# Patient Record
Sex: Male | Born: 1940 | Race: White | Hispanic: No | Marital: Married | Smoking: Former smoker
Health system: Southern US, Community
[De-identification: ages and names within clinical notes are randomized; demographics above are authoritative.]

## PROBLEM LIST (undated history)

## (undated) DIAGNOSIS — N183 Chronic kidney disease, stage 3 unspecified: Secondary | ICD-10-CM

## (undated) DIAGNOSIS — E7800 Pure hypercholesterolemia, unspecified: Secondary | ICD-10-CM

## (undated) DIAGNOSIS — H25019 Cortical age-related cataract, unspecified eye: Secondary | ICD-10-CM

## (undated) DIAGNOSIS — K635 Polyp of colon: Secondary | ICD-10-CM

## (undated) DIAGNOSIS — E785 Hyperlipidemia, unspecified: Secondary | ICD-10-CM

## (undated) DIAGNOSIS — I10 Essential (primary) hypertension: Secondary | ICD-10-CM

## (undated) DIAGNOSIS — M549 Dorsalgia, unspecified: Secondary | ICD-10-CM

## (undated) DIAGNOSIS — G8929 Other chronic pain: Secondary | ICD-10-CM

## (undated) DIAGNOSIS — E119 Type 2 diabetes mellitus without complications: Secondary | ICD-10-CM

## (undated) DIAGNOSIS — K644 Residual hemorrhoidal skin tags: Secondary | ICD-10-CM

## (undated) DIAGNOSIS — M1990 Unspecified osteoarthritis, unspecified site: Secondary | ICD-10-CM

## (undated) HISTORY — PX: CHOLECYSTECTOMY: SHX55

## (undated) HISTORY — PX: CATARACT EXTRACTION: SUR2

## (undated) HISTORY — PX: APPENDECTOMY: SHX54

---

## 2009-06-27 ENCOUNTER — Ambulatory Visit: Payer: Self-pay | Admitting: Urology

## 2009-09-29 ENCOUNTER — Ambulatory Visit: Payer: Self-pay | Admitting: Internal Medicine

## 2010-03-31 ENCOUNTER — Ambulatory Visit: Payer: Self-pay | Admitting: Internal Medicine

## 2010-09-06 IMAGING — CT CT ABDOMEN W/ CM
1 of 2 series · 14 of 32 positions shown, 19 images · non-contrast
Comparison: None

REASON FOR EXAM: 6 MO F/U liver mass
COMMENTS:

PROCEDURE:     CT  - CT ABDOMEN STANDARD W  - March 31, 2010  [DATE]
RESULT:     History: 09/29/2009, 06/27/2009
TECHNIQUE: Multiple axial images of the abdomen were performed from the lung
bases to the iliac crests, with p.o. contrast and with 100 mL of Osovue-EF3
intravenous contrast.

[Series 2: abdomen · axial · 0.77mm/px · z∈[-821,-536]mm · 14 of 65 slices shown, 19 images]
[im 4/65  soft-tissue]
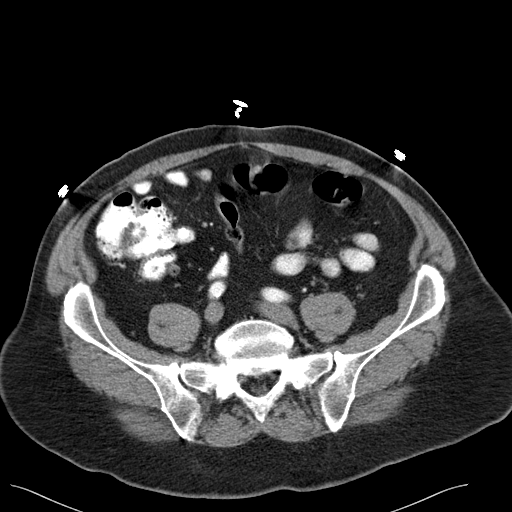
[im 4/65  bone]
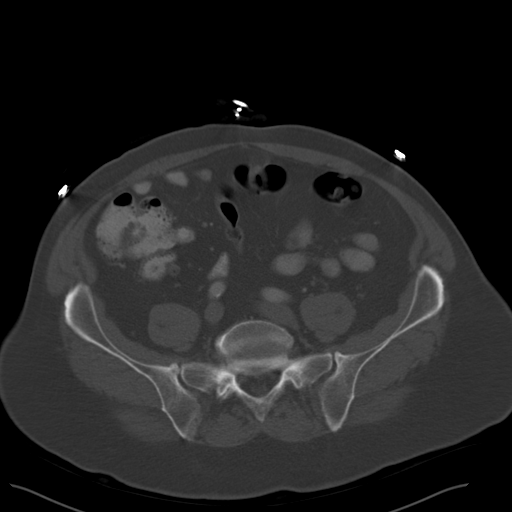
[im 10/65  soft-tissue]
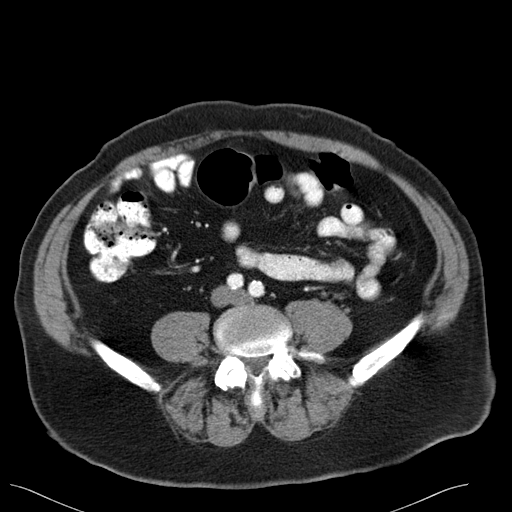
[im 13/65  soft-tissue]
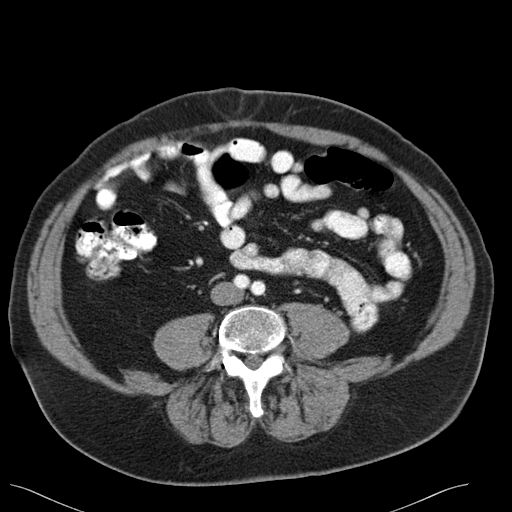
[im 19/65  soft-tissue]
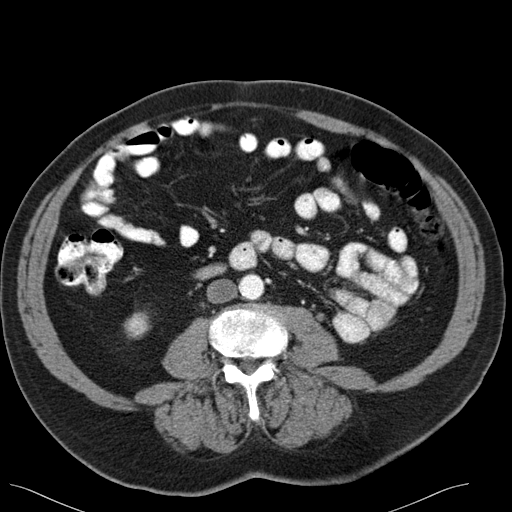
[im 22/65  soft-tissue]
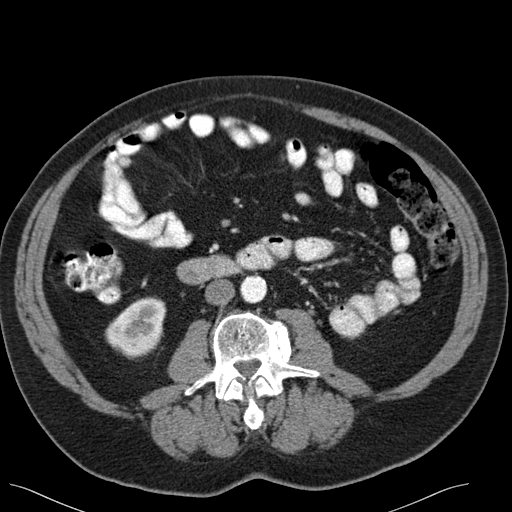
[im 28/65  soft-tissue]
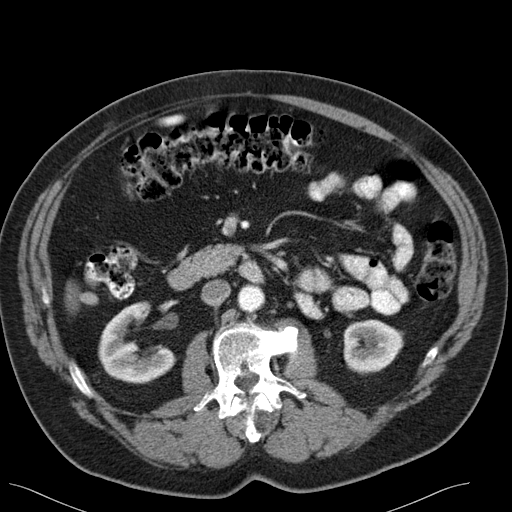
[im 34/65  soft-tissue]
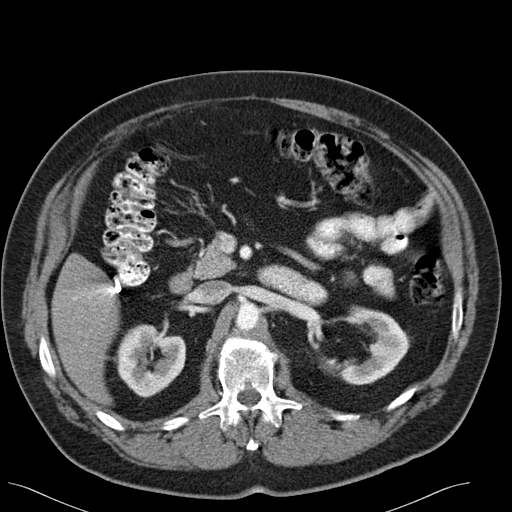
[im 37/65  soft-tissue]
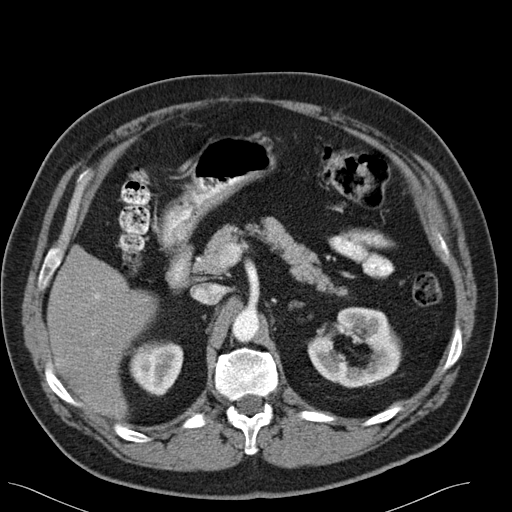
[im 43/65  soft-tissue]
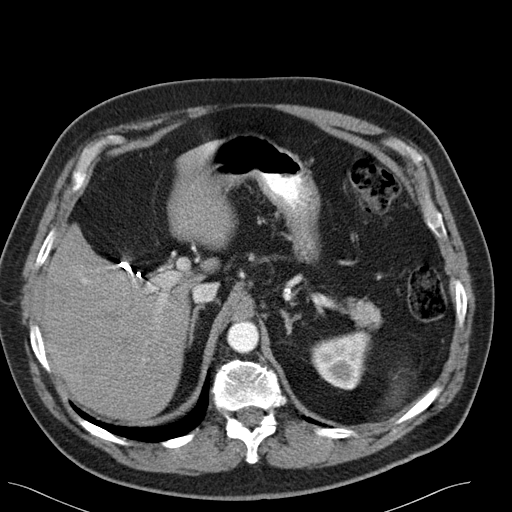
[im 43/65  bone]
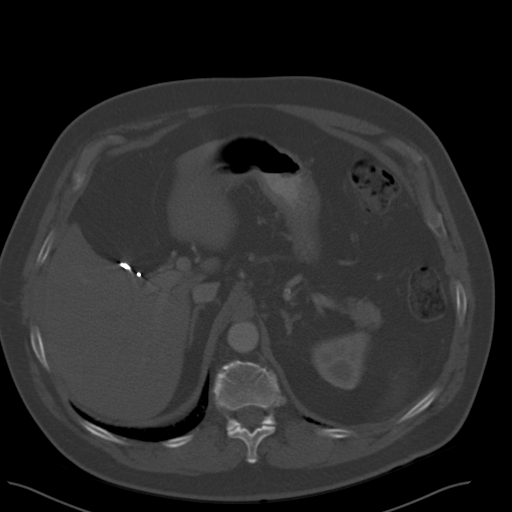
[im 46/65  soft-tissue]
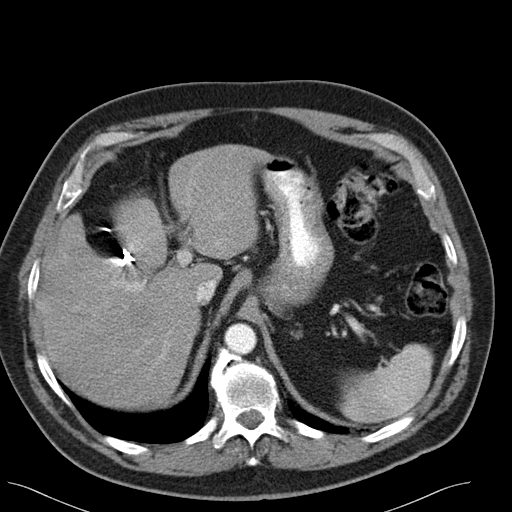
[im 52/65  soft-tissue]
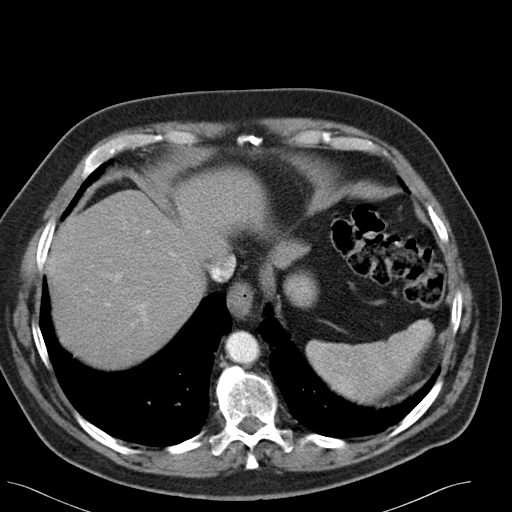
[im 52/65  lung]
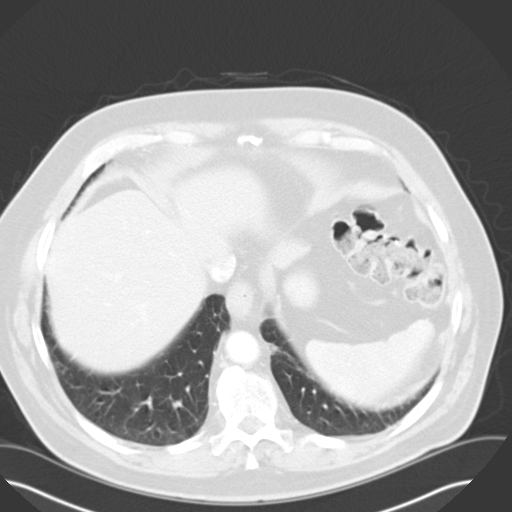
[im 55/65  soft-tissue]
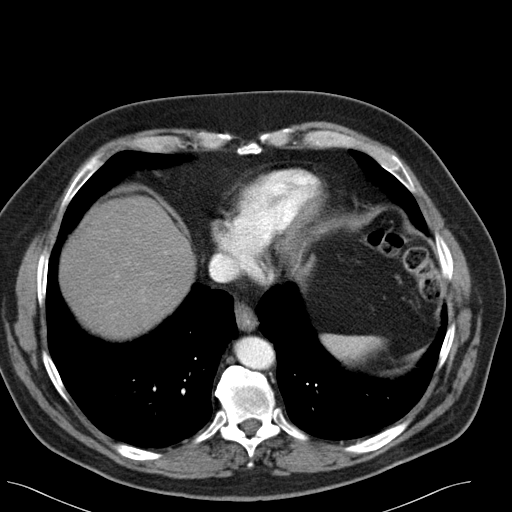
[im 55/65  lung]
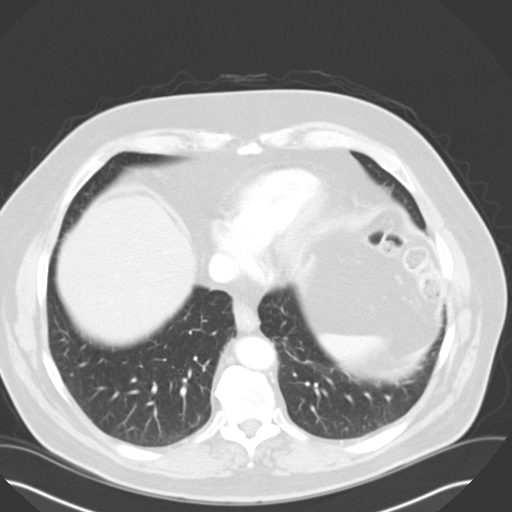
[im 58/65  lung]
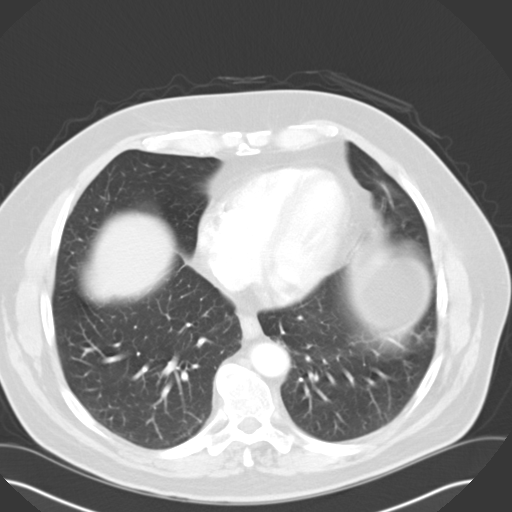
[im 61/65  soft-tissue]
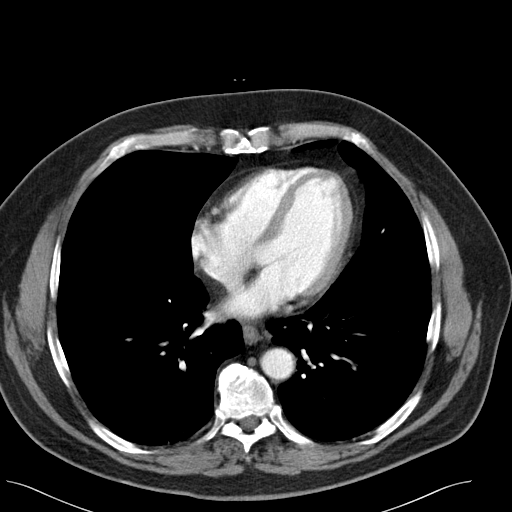
[im 61/65  lung]
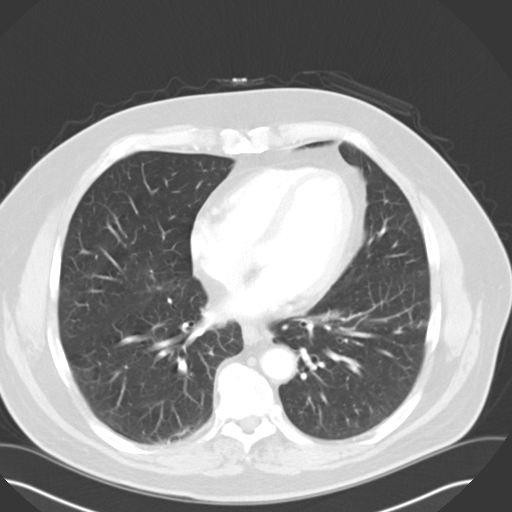

[14 of 32 positions shown; findings below may reference images not displayed]

FINDINGS: The lung bases are clear. There is no pneumothorax. The heart size is
normal.

The liver demonstrates no focal abnormality. There is no intrahepatic or
extrahepatic biliary ductal dilatation. There is a stable nodular density
inferior to the right hepatic lobe which may represent a lymph node which is
unchanged compared with 09/29/2009 and 06/27/2009. The gallbladder is
surgically absent. The spleen demonstrates no focal abnormality. The
kidneys, adrenal glands, and pancreas are normal.

There is a small fat-containing umbilical hernia. There is a small axial
type hiatal hernia. The visualized portions of the duodenum, small
intestine, and large intestine demonstrate no contrast extravasation or
dilatation. There is no pneumoperitoneum, pneumatosis, or portal venous gas.
There is no abdominal free fluid. There is no lymphadenopathy.

The abdominal aorta is normal in caliber with atherosclerosis.

The osseous structures are unremarkable.
IMPRESSION: There is a stable nodular density inferior to the right hepatic lobe which
may represent a lymph node which is unchanged compared with 09/29/2009 and
06/27/2009.

## 2015-09-18 ENCOUNTER — Encounter: Payer: Self-pay | Admitting: *Deleted

## 2015-09-19 ENCOUNTER — Ambulatory Visit
Admission: RE | Admit: 2015-09-19 | Discharge: 2015-09-19 | Disposition: A | Discharge status: Home / Self Care | Payer: Medicare HMO | Source: Ambulatory Visit | Attending: Gastroenterology | Admitting: Gastroenterology

## 2015-09-19 ENCOUNTER — Ambulatory Visit: Payer: Medicare HMO | Admitting: Anesthesiology

## 2015-09-19 ENCOUNTER — Encounter: Payer: Self-pay | Admitting: Anesthesiology

## 2015-09-19 ENCOUNTER — Encounter
Admission: RE | Disposition: A | Discharge status: Home / Self Care | Payer: Self-pay | Source: Ambulatory Visit | Attending: Gastroenterology

## 2015-09-19 DIAGNOSIS — D120 Benign neoplasm of cecum: Secondary | ICD-10-CM | POA: Diagnosis not present

## 2015-09-19 DIAGNOSIS — G8929 Other chronic pain: Secondary | ICD-10-CM | POA: Insufficient documentation

## 2015-09-19 DIAGNOSIS — Z87891 Personal history of nicotine dependence: Secondary | ICD-10-CM | POA: Diagnosis not present

## 2015-09-19 DIAGNOSIS — D122 Benign neoplasm of ascending colon: Secondary | ICD-10-CM | POA: Insufficient documentation

## 2015-09-19 DIAGNOSIS — K640 First degree hemorrhoids: Secondary | ICD-10-CM | POA: Diagnosis not present

## 2015-09-19 DIAGNOSIS — E1122 Type 2 diabetes mellitus with diabetic chronic kidney disease: Secondary | ICD-10-CM | POA: Insufficient documentation

## 2015-09-19 DIAGNOSIS — E780 Pure hypercholesterolemia: Secondary | ICD-10-CM | POA: Diagnosis not present

## 2015-09-19 DIAGNOSIS — I10 Essential (primary) hypertension: Secondary | ICD-10-CM | POA: Insufficient documentation

## 2015-09-19 DIAGNOSIS — N183 Chronic kidney disease, stage 3 (moderate): Secondary | ICD-10-CM | POA: Insufficient documentation

## 2015-09-19 DIAGNOSIS — Z79899 Other long term (current) drug therapy: Secondary | ICD-10-CM | POA: Insufficient documentation

## 2015-09-19 DIAGNOSIS — I129 Hypertensive chronic kidney disease with stage 1 through stage 4 chronic kidney disease, or unspecified chronic kidney disease: Secondary | ICD-10-CM | POA: Insufficient documentation

## 2015-09-19 DIAGNOSIS — E785 Hyperlipidemia, unspecified: Secondary | ICD-10-CM | POA: Diagnosis not present

## 2015-09-19 DIAGNOSIS — Z7982 Long term (current) use of aspirin: Secondary | ICD-10-CM | POA: Insufficient documentation

## 2015-09-19 DIAGNOSIS — Z1211 Encounter for screening for malignant neoplasm of colon: Secondary | ICD-10-CM | POA: Diagnosis not present

## 2015-09-19 DIAGNOSIS — K644 Residual hemorrhoidal skin tags: Secondary | ICD-10-CM | POA: Diagnosis not present

## 2015-09-19 DIAGNOSIS — M549 Dorsalgia, unspecified: Secondary | ICD-10-CM | POA: Diagnosis not present

## 2015-09-19 HISTORY — DX: Essential (primary) hypertension: I10

## 2015-09-19 HISTORY — DX: Hyperlipidemia, unspecified: E78.5

## 2015-09-19 HISTORY — DX: Type 2 diabetes mellitus without complications: E11.9

## 2015-09-19 HISTORY — PX: COLONOSCOPY WITH PROPOFOL: SHX5780

## 2015-09-19 HISTORY — DX: Other chronic pain: G89.29

## 2015-09-19 HISTORY — DX: Dorsalgia, unspecified: M54.9

## 2015-09-19 HISTORY — DX: Pure hypercholesterolemia, unspecified: E78.00

## 2015-09-19 LAB — GLUCOSE, CAPILLARY: GLUCOSE-CAPILLARY: 132 mg/dL — AB (ref 65–99)

## 2015-09-19 SURGERY — COLONOSCOPY WITH PROPOFOL
Anesthesia: General

## 2015-09-19 MED ORDER — PROPOFOL 10 MG/ML IV BOLUS
INTRAVENOUS | Status: DC | PRN
  Administered 2015-09-19: 30 mg via INTRAVENOUS
  Administered 2015-09-19 (×2): 20 mg via INTRAVENOUS

## 2015-09-19 MED ORDER — LIDOCAINE HCL (CARDIAC) 20 MG/ML IV SOLN
INTRAVENOUS | Status: DC | PRN
  Administered 2015-09-19: 60 mg via INTRAVENOUS

## 2015-09-19 MED ORDER — SODIUM CHLORIDE 0.9 % IV SOLN: INTRAVENOUS | Status: DC

## 2015-09-19 MED ORDER — PROPOFOL 500 MG/50ML IV EMUL
INTRAVENOUS | Status: DC | PRN
  Administered 2015-09-19: 100 ug/kg/min via INTRAVENOUS

## 2015-09-19 MED ORDER — SODIUM CHLORIDE 0.9 % IV SOLN
INTRAVENOUS | Status: DC
  Administered 2015-09-19: 1000 mL via INTRAVENOUS

## 2015-09-19 MED ORDER — PHENYLEPHRINE HCL 10 MG/ML IJ SOLN
INTRAMUSCULAR | Status: DC | PRN
  Administered 2015-09-19 (×4): 200 ug via INTRAVENOUS

## 2015-09-19 NOTE — Anesthesia Postprocedure Evaluation (Signed)
  Anesthesia Post-op Note  Patient: Juan Calhoun  Procedure(s) Performed: Procedure(s): COLONOSCOPY WITH PROPOFOL (N/A)  Anesthesia type:General  Patient location: PACU  Post pain: Pain level controlled  Post assessment: Post-op Vital signs reviewed, Patient's Cardiovascular Status Stable, Respiratory Function Stable, Patent Airway and No signs of Nausea or vomiting  Post vital signs: Reviewed and stable  Last Vitals:  Filed Vitals:   09/19/15 0920  BP: 100/72  Pulse: 64  Temp:   Resp: 16    Level of consciousness: awake, alert  and patient cooperative  Complications: No apparent anesthesia complications

## 2015-09-19 NOTE — Op Note (Signed)
[facility] Patient Name: Juan Calhoun Procedure Date: 09/19/2015 7:47 AM MRN: [id number] Account #: [id number] Date of Birth: 08-18-1941 Admit Type: Outpatient Age: 74 Room: [facility] ENDO ROOM 1 Gender: Male Note Status: Finalized Procedure:         Colonoscopy Indications:       Screening for colorectal malignant neoplasm, Last                     colonoscopy over 10 years ago Patient Profile:   This is a 74 year old male. Providers:         Gerrit Heck. Rayann Heman, MD Referring MD:      Ocie Cornfield. Ouida Sills, MD (Referring MD) Medicines:         Propofol per Anesthesia Complications:     No immediate complications. Procedure:         Pre-Anesthesia Assessment:                    - Prior to the procedure, a History and Physical was                     performed, and patient medications, allergies and                     sensitivities were reviewed. The patient's tolerance of                     previous anesthesia was reviewed.                    After obtaining informed consent, the colonoscope was                     passed under direct vision. Throughout the procedure, the                     patient's blood pressure, pulse, and oxygen saturations                     were monitored continuously. The Colonoscope was                     introduced through the anus and advanced to the the cecum,                     identified by appendiceal orifice and ileocecal valve. The                     colonoscopy was performed without difficulty. The patient                     tolerated the procedure well. The quality of the bowel                     preparation was good. Findings:      The perianal exam findings include non-thrombosed external hemorrhoids.      A 3 mm polyp was found in the cecum. The polyp was sessile. The polyp       was removed with a jumbo cold forceps. Resection and retrieval were       complete.      A 8 mm polyp was found in the  ascending colon. The polyp was flat. The       polyp was removed with a hot snare. Resection and retrieval were  complete.      Internal hemorrhoids were found during retroflexion. The hemorrhoids       were Grade I (internal hemorrhoids that do not prolapse).      The exam was otherwise without abnormality. Impression:        - Non-thrombosed external hemorrhoids found on perianal                     exam.                    - One 3 mm polyp in the cecum. Resected and retrieved.                    - One 8 mm polyp in the ascending colon. Resected and                     retrieved.                    - Internal hemorrhoids.                    - The examination was otherwise normal. Recommendation:    - Observe patient in GI recovery unit.                    - High fiber diet.                    - Continue present medications.                    - Await pathology results.                    - Repeat colonoscopy for surveillance based on pathology                     results.                    - Return to referring physician.                    - The findings and recommendations were discussed with the                     patient.                    - The findings and recommendations were discussed with the                     patient's family. Procedure Code(s): --- Professional ---                    [phone], Colonoscopy, flexible; with removal of tumor(s),                     polyp(s), or other lesion(s) by snare technique                    23557, 73, Colonoscopy, flexible; with biopsy, single or                     multiple CPT copyright 2014 American Medical [employer]. All rights reserved. The codes documented in this report are preliminary and upon coder review may  be revised to meet current compliance requirements. Mellody Life, MD 09/19/2015 8:45:31 AM This report  has been signed electronically. Number of Addenda: 0 Note Initiated On: 09/19/2015 7:47 AM Scope  Withdrawal Time: 0 hours 16 minutes 11 seconds  Total Procedure Duration: 0 hours 22 minutes 26 seconds       [facility]

## 2015-09-19 NOTE — Discharge Instructions (Signed)

## 2015-09-19 NOTE — Anesthesia Preprocedure Evaluation (Signed)
Anesthesia Evaluation  Patient identified by MRN, date of birth, ID band Patient awake    Reviewed: Allergy & Precautions, H&P , NPO status , Patient's Chart, lab work & pertinent test results  Airway Mallampati: III  TM Distance: >3 FB Neck ROM: limited    Dental  (+) Poor Dentition, Chipped   Pulmonary neg pulmonary ROS, former smoker,    Pulmonary exam normal breath sounds clear to auscultation       Cardiovascular Exercise Tolerance: Good hypertension, (-) Past MI Normal cardiovascular exam Rhythm:regular Rate:Normal     Neuro/Psych negative neurological ROS  negative psych ROS   GI/Hepatic negative GI ROS, Neg liver ROS, neg GERD  ,  Endo/Other  diabetes, Type 2, Oral Hypoglycemic Agents  Renal/GU negative Renal ROS  negative genitourinary   Musculoskeletal   Abdominal   Peds  Hematology negative hematology ROS (+)   Anesthesia Other Findings Past Medical History:   Diabetes mellitus without complication                       Hypertension                                                 Hypercholesterolemia                                         Hyperlipidemia                                               Chronic back pain                                            Reproductive/Obstetrics negative OB ROS                             Anesthesia Physical Anesthesia Plan  ASA: III  Anesthesia Plan: General   Post-op Pain Management:    Induction:   Airway Management Planned:   Additional Equipment:   Intra-op Plan:   Post-operative Plan:   Informed Consent: I have reviewed the patients History and Physical, chart, labs and discussed the procedure including the risks, benefits and alternatives for the proposed anesthesia with the patient or authorized representative who has indicated his/her understanding and acceptance.   Dental Advisory Given  Plan Discussed with:  Anesthesiologist, CRNA and Surgeon  Anesthesia Plan Comments:         Anesthesia Quick Evaluation

## 2015-09-19 NOTE — Transfer of Care (Signed)
Immediate Anesthesia Transfer of Care Note  Patient: Juan Calhoun  Procedure(s) Performed: Procedure(s): COLONOSCOPY WITH PROPOFOL (N/A)  Patient Location: PACU  Anesthesia Type:MAC  Level of Consciousness: sedated  Airway & Oxygen Therapy: Patient Spontanous Breathing  Post-op Assessment: Report given to RN  Post vital signs: Reviewed  Last Vitals:  Filed Vitals:   09/19/15 0718  BP: 117/72  Pulse: 76  Temp: 36.2 C  Resp: 16    Complications: No apparent anesthesia complications

## 2015-09-19 NOTE — H&P (Signed)
  Primary Care Physician:  Kirk Ruths., MD  Pre-Procedure History & Physical: HPI:  Juan Calhoun is a 74 y.o. male is here for an colonoscopy.   Past Medical History  Diagnosis Date  . Diabetes mellitus without complication   . Hypertension   . Hypercholesterolemia   . Hyperlipidemia   . Chronic back pain     Past Surgical History  Procedure Laterality Date  . Cholecystectomy    . Appendectomy      Prior to Admission medications   Medication Sig Start Date End Date Taking? Authorizing Provider  aspirin EC 81 MG tablet Take 81 mg by mouth daily.   Yes Historical Provider, MD  atorvastatin (LIPITOR) 40 MG tablet Take 40 mg by mouth daily.   Yes Historical Provider, MD  azelastine (ASTELIN) 0.1 % nasal spray Place 1 spray into both nostrils 2 (two) times daily. Use in each nostril as directed   Yes Historical Provider, MD  lisinopril-hydrochlorothiazide (PRINZIDE,ZESTORETIC) 10-12.5 MG per tablet Take 1 tablet by mouth daily.   Yes Historical Provider, MD  metFORMIN (GLUCOPHAGE-XR) 500 MG 24 hr tablet Take 1,000 mg by mouth daily with breakfast. TAKE 2 TABLETS DAILY with SUPPER   Yes Historical Provider, MD    Allergies as of 08/20/2015  . (Not on File)    History reviewed. No pertinent family history.  Social History   Social History  . Marital Status: Widowed    Spouse Name: N/A  . Number of Children: N/A  . Years of Education: N/A   Occupational History  . Not on file.   Social History Main Topics  . Smoking status: Former Research scientist (life sciences)  . Smokeless tobacco: Never Used  . Alcohol Use: No  . Drug Use: No  . Sexual Activity: Not on file   Other Topics Concern  . Not on file   Social History Narrative     Physical Exam: BP 117/72 mmHg  Pulse 76  Temp(Src) 97.2 F (36.2 C) (Tympanic)  Resp 16  Ht 6' (1.829 m)  Wt 96.616 kg (213 lb)  BMI 28.88 kg/m2  SpO2 99% General:   Alert,  pleasant and cooperative in NAD Head:  Normocephalic and  atraumatic. Neck:  Supple; no masses or thyromegaly. Lungs:  Clear throughout to auscultation.    Heart:  Regular rate and rhythm. Abdomen:  Soft, nontender and nondistended. Normal bowel sounds, without guarding, and without rebound.   Neurologic:  Alert and  oriented x4;  grossly normal neurologically.  Impression/Plan: Juan Calhoun is here for an colonoscopy to be performed for screening  Risks, benefits, limitations, and alternatives regarding  colonoscopy have been reviewed with the patient.  Questions have been answered.  All parties agreeable.   Josefine Class, MD  09/19/2015, 8:12 AM

## 2015-09-21 ENCOUNTER — Encounter: Payer: Self-pay | Admitting: Gastroenterology

## 2015-09-22 LAB — SURGICAL PATHOLOGY

## 2018-10-06 DIAGNOSIS — E875 Hyperkalemia: Secondary | ICD-10-CM

## 2018-10-06 DIAGNOSIS — J9690 Respiratory failure, unspecified, unspecified whether with hypoxia or hypercapnia: Secondary | ICD-10-CM | POA: Diagnosis not present

## 2018-10-06 DIAGNOSIS — N179 Acute kidney failure, unspecified: Secondary | ICD-10-CM | POA: Diagnosis not present

## 2018-10-07 DIAGNOSIS — N179 Acute kidney failure, unspecified: Secondary | ICD-10-CM | POA: Diagnosis not present

## 2018-10-07 DIAGNOSIS — E875 Hyperkalemia: Secondary | ICD-10-CM | POA: Diagnosis not present

## 2018-10-07 DIAGNOSIS — J9690 Respiratory failure, unspecified, unspecified whether with hypoxia or hypercapnia: Secondary | ICD-10-CM | POA: Diagnosis not present

## 2018-10-08 DIAGNOSIS — E875 Hyperkalemia: Secondary | ICD-10-CM | POA: Diagnosis not present

## 2018-10-08 DIAGNOSIS — N179 Acute kidney failure, unspecified: Secondary | ICD-10-CM | POA: Diagnosis not present

## 2018-10-08 DIAGNOSIS — J9690 Respiratory failure, unspecified, unspecified whether with hypoxia or hypercapnia: Secondary | ICD-10-CM | POA: Diagnosis not present

## 2018-10-09 DIAGNOSIS — E875 Hyperkalemia: Secondary | ICD-10-CM | POA: Diagnosis not present

## 2018-10-09 DIAGNOSIS — N179 Acute kidney failure, unspecified: Secondary | ICD-10-CM | POA: Diagnosis not present

## 2018-10-09 DIAGNOSIS — J9690 Respiratory failure, unspecified, unspecified whether with hypoxia or hypercapnia: Secondary | ICD-10-CM | POA: Diagnosis not present

## 2018-10-10 DIAGNOSIS — E875 Hyperkalemia: Secondary | ICD-10-CM | POA: Diagnosis not present

## 2018-10-10 DIAGNOSIS — J9690 Respiratory failure, unspecified, unspecified whether with hypoxia or hypercapnia: Secondary | ICD-10-CM | POA: Diagnosis not present

## 2018-10-10 DIAGNOSIS — N179 Acute kidney failure, unspecified: Secondary | ICD-10-CM | POA: Diagnosis not present

## 2018-10-11 DIAGNOSIS — E875 Hyperkalemia: Secondary | ICD-10-CM | POA: Diagnosis not present

## 2018-10-11 DIAGNOSIS — J9690 Respiratory failure, unspecified, unspecified whether with hypoxia or hypercapnia: Secondary | ICD-10-CM | POA: Diagnosis not present

## 2018-10-11 DIAGNOSIS — N179 Acute kidney failure, unspecified: Secondary | ICD-10-CM | POA: Diagnosis not present

## 2018-10-12 DIAGNOSIS — E875 Hyperkalemia: Secondary | ICD-10-CM | POA: Diagnosis not present

## 2018-10-12 DIAGNOSIS — J9690 Respiratory failure, unspecified, unspecified whether with hypoxia or hypercapnia: Secondary | ICD-10-CM | POA: Diagnosis not present

## 2018-10-12 DIAGNOSIS — N179 Acute kidney failure, unspecified: Secondary | ICD-10-CM | POA: Diagnosis not present

## 2018-10-13 DIAGNOSIS — N179 Acute kidney failure, unspecified: Secondary | ICD-10-CM | POA: Diagnosis not present

## 2018-10-13 DIAGNOSIS — E875 Hyperkalemia: Secondary | ICD-10-CM | POA: Diagnosis not present

## 2018-10-13 DIAGNOSIS — J9690 Respiratory failure, unspecified, unspecified whether with hypoxia or hypercapnia: Secondary | ICD-10-CM | POA: Diagnosis not present

## 2018-10-14 DIAGNOSIS — N179 Acute kidney failure, unspecified: Secondary | ICD-10-CM | POA: Diagnosis not present

## 2018-10-14 DIAGNOSIS — E875 Hyperkalemia: Secondary | ICD-10-CM | POA: Diagnosis not present

## 2018-10-14 DIAGNOSIS — J9690 Respiratory failure, unspecified, unspecified whether with hypoxia or hypercapnia: Secondary | ICD-10-CM | POA: Diagnosis not present

## 2018-10-15 DIAGNOSIS — N179 Acute kidney failure, unspecified: Secondary | ICD-10-CM | POA: Diagnosis not present

## 2018-10-15 DIAGNOSIS — E875 Hyperkalemia: Secondary | ICD-10-CM | POA: Diagnosis not present

## 2018-10-15 DIAGNOSIS — J9690 Respiratory failure, unspecified, unspecified whether with hypoxia or hypercapnia: Secondary | ICD-10-CM | POA: Diagnosis not present

## 2018-10-16 DIAGNOSIS — E875 Hyperkalemia: Secondary | ICD-10-CM | POA: Diagnosis not present

## 2018-10-16 DIAGNOSIS — N179 Acute kidney failure, unspecified: Secondary | ICD-10-CM | POA: Diagnosis not present

## 2018-10-16 DIAGNOSIS — J9690 Respiratory failure, unspecified, unspecified whether with hypoxia or hypercapnia: Secondary | ICD-10-CM | POA: Diagnosis not present

## 2018-11-02 DIAGNOSIS — I10 Essential (primary) hypertension: Secondary | ICD-10-CM | POA: Diagnosis not present

## 2018-11-02 DIAGNOSIS — I4891 Unspecified atrial fibrillation: Secondary | ICD-10-CM

## 2018-11-02 DIAGNOSIS — N400 Enlarged prostate without lower urinary tract symptoms: Secondary | ICD-10-CM

## 2018-11-02 DIAGNOSIS — E119 Type 2 diabetes mellitus without complications: Secondary | ICD-10-CM

## 2018-11-02 DIAGNOSIS — K5720 Diverticulitis of large intestine with perforation and abscess without bleeding: Secondary | ICD-10-CM

## 2018-11-03 DIAGNOSIS — I4891 Unspecified atrial fibrillation: Secondary | ICD-10-CM | POA: Diagnosis not present

## 2018-11-03 DIAGNOSIS — I10 Essential (primary) hypertension: Secondary | ICD-10-CM | POA: Diagnosis not present

## 2018-11-03 DIAGNOSIS — K5720 Diverticulitis of large intestine with perforation and abscess without bleeding: Secondary | ICD-10-CM | POA: Diagnosis not present

## 2018-11-03 DIAGNOSIS — E119 Type 2 diabetes mellitus without complications: Secondary | ICD-10-CM | POA: Diagnosis not present

## 2018-11-04 DIAGNOSIS — I4891 Unspecified atrial fibrillation: Secondary | ICD-10-CM | POA: Diagnosis not present

## 2018-11-04 DIAGNOSIS — I10 Essential (primary) hypertension: Secondary | ICD-10-CM | POA: Diagnosis not present

## 2018-11-04 DIAGNOSIS — K5720 Diverticulitis of large intestine with perforation and abscess without bleeding: Secondary | ICD-10-CM | POA: Diagnosis not present

## 2018-11-04 DIAGNOSIS — E119 Type 2 diabetes mellitus without complications: Secondary | ICD-10-CM | POA: Diagnosis not present

## 2018-11-05 DIAGNOSIS — I4891 Unspecified atrial fibrillation: Secondary | ICD-10-CM | POA: Diagnosis not present

## 2018-11-05 DIAGNOSIS — E119 Type 2 diabetes mellitus without complications: Secondary | ICD-10-CM | POA: Diagnosis not present

## 2018-11-05 DIAGNOSIS — K5720 Diverticulitis of large intestine with perforation and abscess without bleeding: Secondary | ICD-10-CM | POA: Diagnosis not present

## 2018-11-05 DIAGNOSIS — I10 Essential (primary) hypertension: Secondary | ICD-10-CM | POA: Diagnosis not present

## 2018-11-06 DIAGNOSIS — E119 Type 2 diabetes mellitus without complications: Secondary | ICD-10-CM | POA: Diagnosis not present

## 2018-11-06 DIAGNOSIS — I4891 Unspecified atrial fibrillation: Secondary | ICD-10-CM | POA: Diagnosis not present

## 2018-11-06 DIAGNOSIS — I10 Essential (primary) hypertension: Secondary | ICD-10-CM | POA: Diagnosis not present

## 2018-11-06 DIAGNOSIS — K5720 Diverticulitis of large intestine with perforation and abscess without bleeding: Secondary | ICD-10-CM | POA: Diagnosis not present

## 2018-11-07 DIAGNOSIS — I10 Essential (primary) hypertension: Secondary | ICD-10-CM | POA: Diagnosis not present

## 2018-11-07 DIAGNOSIS — I4891 Unspecified atrial fibrillation: Secondary | ICD-10-CM | POA: Diagnosis not present

## 2018-11-07 DIAGNOSIS — K5720 Diverticulitis of large intestine with perforation and abscess without bleeding: Secondary | ICD-10-CM | POA: Diagnosis not present

## 2018-11-07 DIAGNOSIS — E119 Type 2 diabetes mellitus without complications: Secondary | ICD-10-CM | POA: Diagnosis not present

## 2018-11-08 DIAGNOSIS — I10 Essential (primary) hypertension: Secondary | ICD-10-CM | POA: Diagnosis not present

## 2018-11-08 DIAGNOSIS — E119 Type 2 diabetes mellitus without complications: Secondary | ICD-10-CM | POA: Diagnosis not present

## 2018-11-08 DIAGNOSIS — K5720 Diverticulitis of large intestine with perforation and abscess without bleeding: Secondary | ICD-10-CM | POA: Diagnosis not present

## 2018-11-08 DIAGNOSIS — I4891 Unspecified atrial fibrillation: Secondary | ICD-10-CM | POA: Diagnosis not present

## 2018-11-09 DIAGNOSIS — K5720 Diverticulitis of large intestine with perforation and abscess without bleeding: Secondary | ICD-10-CM | POA: Diagnosis not present

## 2018-11-09 DIAGNOSIS — I10 Essential (primary) hypertension: Secondary | ICD-10-CM | POA: Diagnosis not present

## 2018-11-09 DIAGNOSIS — I4891 Unspecified atrial fibrillation: Secondary | ICD-10-CM | POA: Diagnosis not present

## 2018-11-09 DIAGNOSIS — E119 Type 2 diabetes mellitus without complications: Secondary | ICD-10-CM | POA: Diagnosis not present

## 2018-11-10 DIAGNOSIS — E119 Type 2 diabetes mellitus without complications: Secondary | ICD-10-CM | POA: Diagnosis not present

## 2018-11-10 DIAGNOSIS — I4891 Unspecified atrial fibrillation: Secondary | ICD-10-CM | POA: Diagnosis not present

## 2018-11-10 DIAGNOSIS — I10 Essential (primary) hypertension: Secondary | ICD-10-CM | POA: Diagnosis not present

## 2018-11-10 DIAGNOSIS — K5720 Diverticulitis of large intestine with perforation and abscess without bleeding: Secondary | ICD-10-CM | POA: Diagnosis not present

## 2018-11-11 DIAGNOSIS — I10 Essential (primary) hypertension: Secondary | ICD-10-CM | POA: Diagnosis not present

## 2018-11-11 DIAGNOSIS — K5720 Diverticulitis of large intestine with perforation and abscess without bleeding: Secondary | ICD-10-CM | POA: Diagnosis not present

## 2018-11-11 DIAGNOSIS — E119 Type 2 diabetes mellitus without complications: Secondary | ICD-10-CM | POA: Diagnosis not present

## 2018-11-11 DIAGNOSIS — I4891 Unspecified atrial fibrillation: Secondary | ICD-10-CM | POA: Diagnosis not present

## 2018-11-12 DIAGNOSIS — K5720 Diverticulitis of large intestine with perforation and abscess without bleeding: Secondary | ICD-10-CM | POA: Diagnosis not present

## 2018-11-12 DIAGNOSIS — I4891 Unspecified atrial fibrillation: Secondary | ICD-10-CM | POA: Diagnosis not present

## 2018-11-12 DIAGNOSIS — E119 Type 2 diabetes mellitus without complications: Secondary | ICD-10-CM | POA: Diagnosis not present

## 2018-11-12 DIAGNOSIS — I10 Essential (primary) hypertension: Secondary | ICD-10-CM | POA: Diagnosis not present

## 2021-01-27 HISTORY — PX: COLONOSCOPY: SHX174

## 2022-02-24 ENCOUNTER — Other Ambulatory Visit: Payer: Self-pay | Admitting: Internal Medicine

## 2022-02-24 DIAGNOSIS — R7989 Other specified abnormal findings of blood chemistry: Secondary | ICD-10-CM

## 2022-02-25 ENCOUNTER — Other Ambulatory Visit: Payer: Self-pay | Admitting: Internal Medicine

## 2022-02-25 DIAGNOSIS — R7989 Other specified abnormal findings of blood chemistry: Secondary | ICD-10-CM

## 2022-03-02 ENCOUNTER — Other Ambulatory Visit: Payer: Self-pay

## 2022-03-02 ENCOUNTER — Ambulatory Visit
Admission: RE | Admit: 2022-03-02 | Discharge: 2022-03-02 | Disposition: A | Discharge status: Home / Self Care | Payer: Medicare Other | Source: Ambulatory Visit | Attending: Internal Medicine | Admitting: Internal Medicine

## 2022-03-02 DIAGNOSIS — R7989 Other specified abnormal findings of blood chemistry: Secondary | ICD-10-CM

## 2022-08-08 IMAGING — US US ABDOMEN LIMITED
1 series · 14 of 25 positions shown · non-contrast
Comparison: None.

CLINICAL DATA: Abnormal liver function tests

EXAM:
ULTRASOUND ABDOMEN LIMITED RIGHT UPPER QUADRANT

[Series 1: us abdomen limited · 0.24mm/px · 14 of 31 slices shown]
[im 1/31]
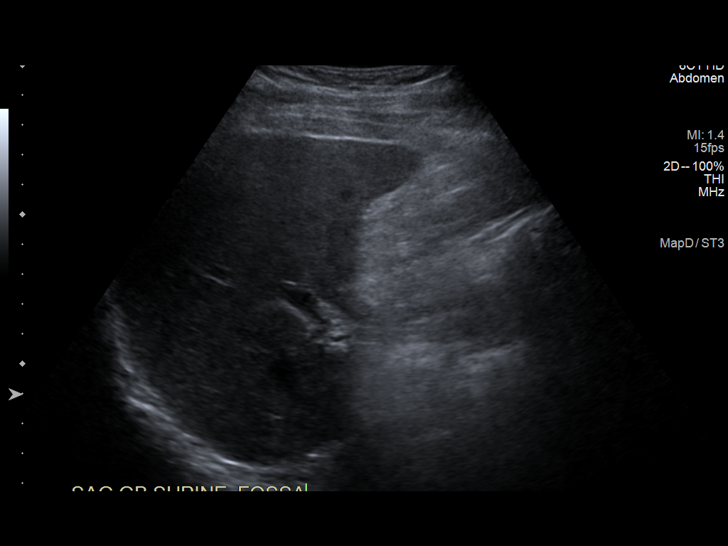
[im 3/31]
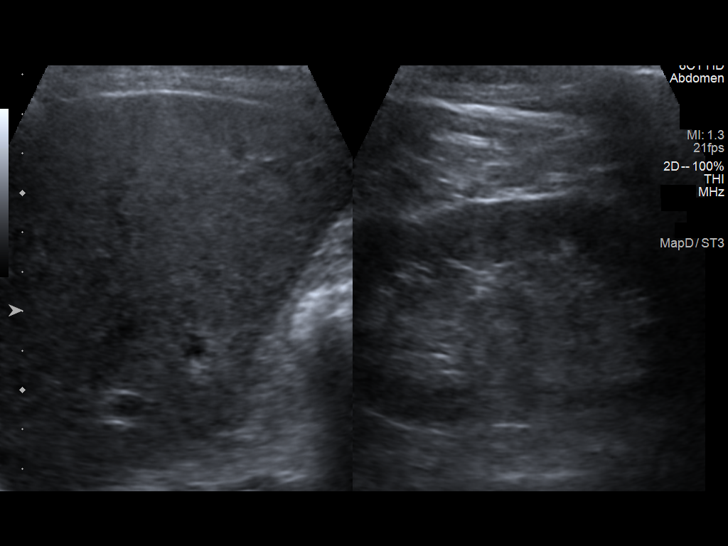
[im 6/31]
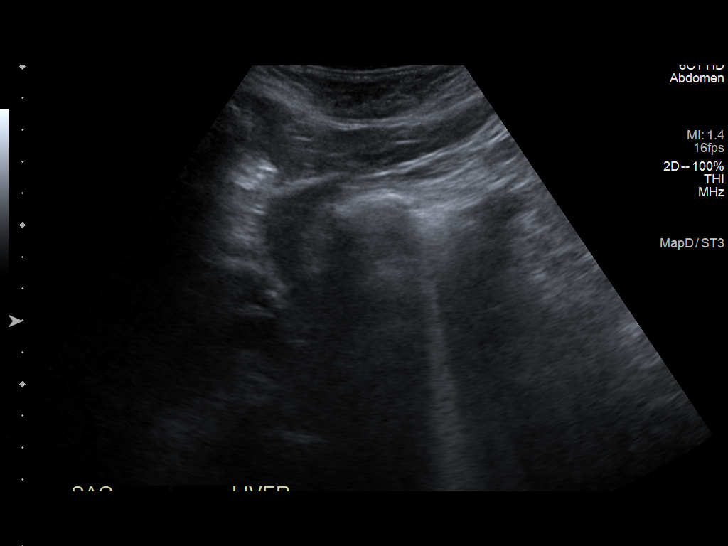
[im 8/31]
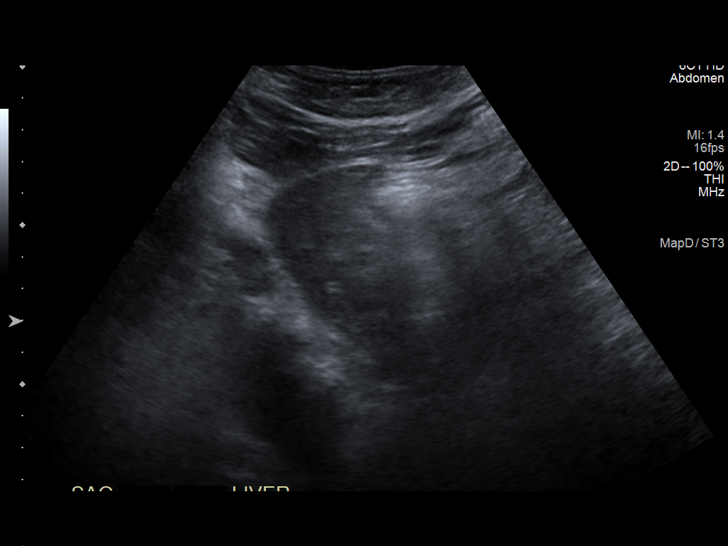
[im 11/31]
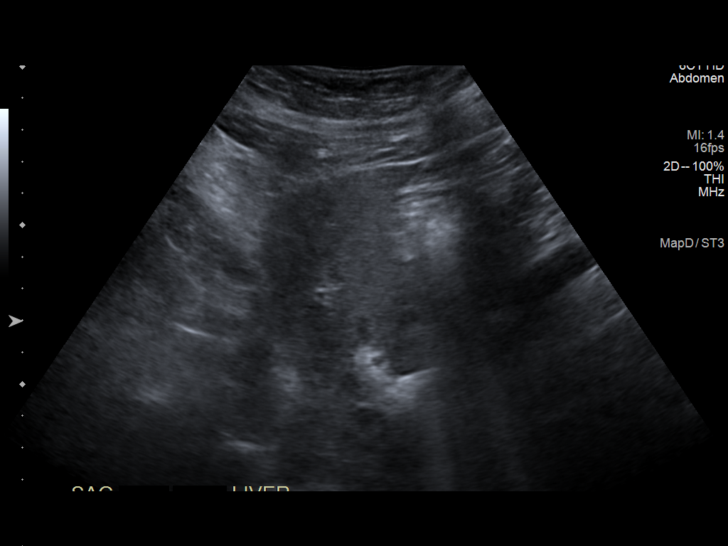
[im 12/31]
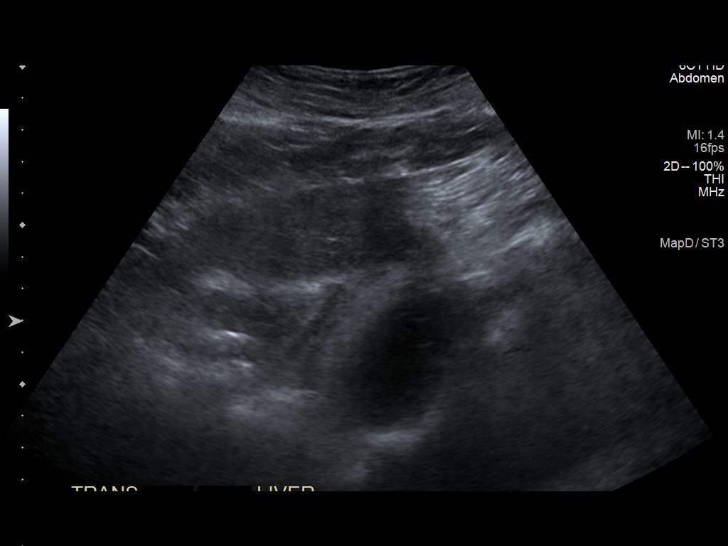
[im 14/31]
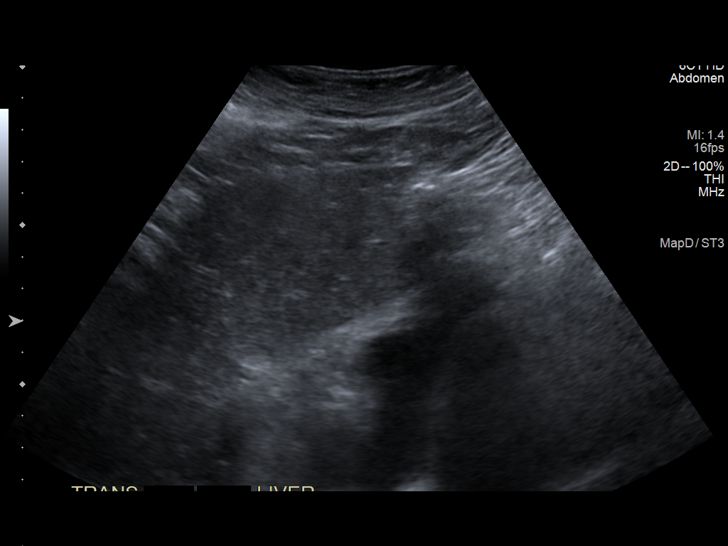
[im 17/31]
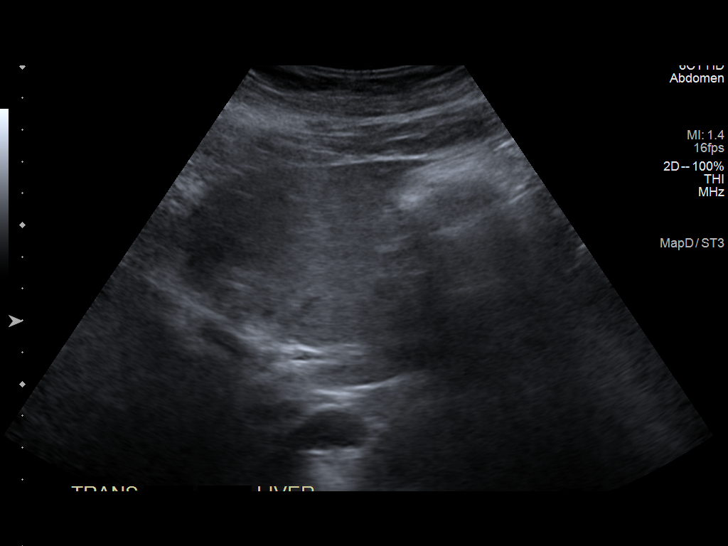
[im 19/31]
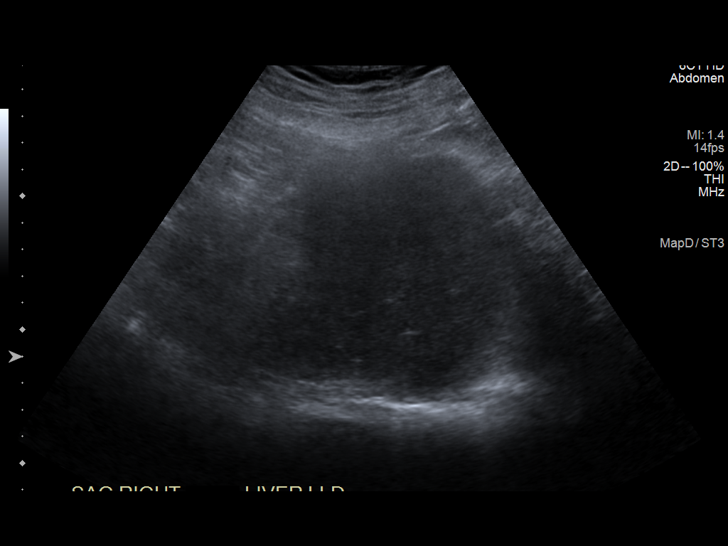
[im 21/31]
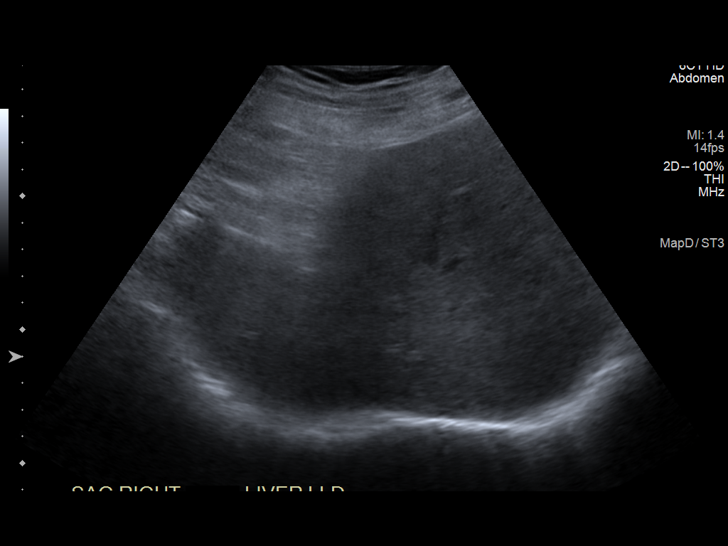
[im 23/31]
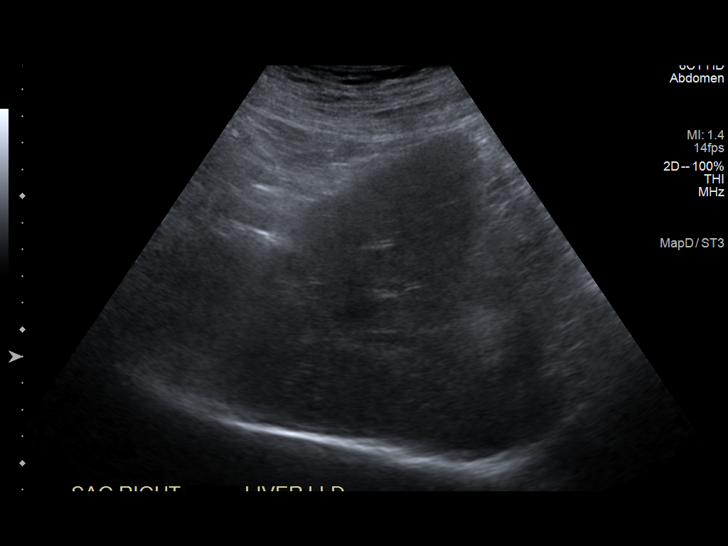
[im 26/31]
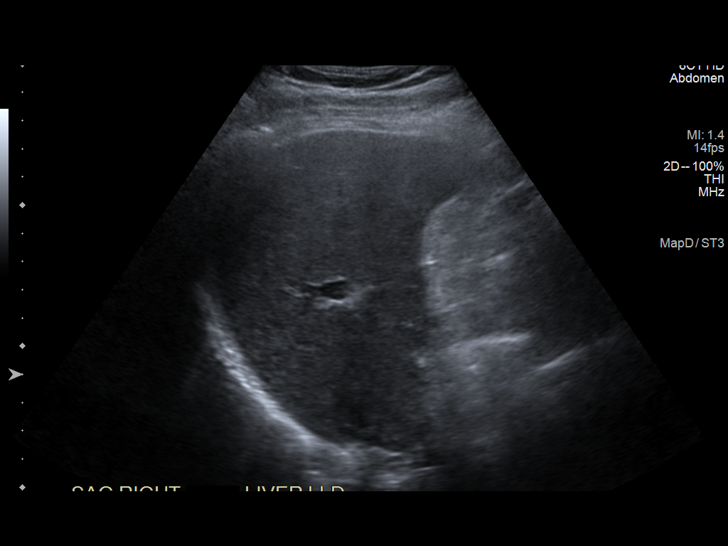
[im 28/31]
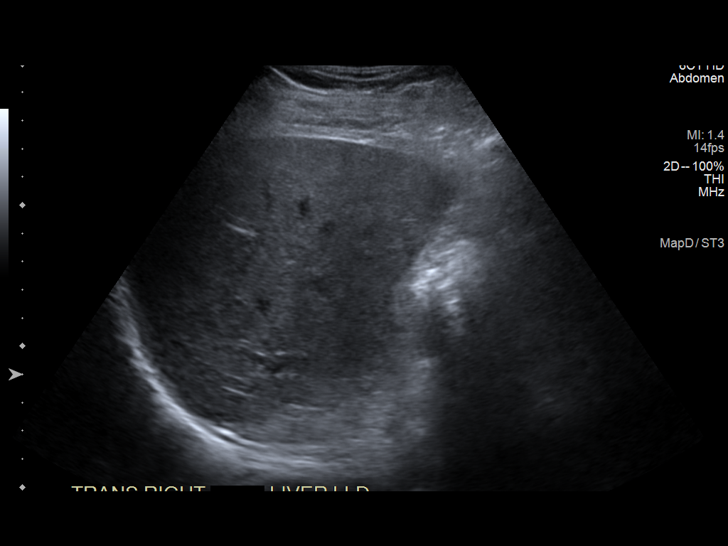
[im 31/31]
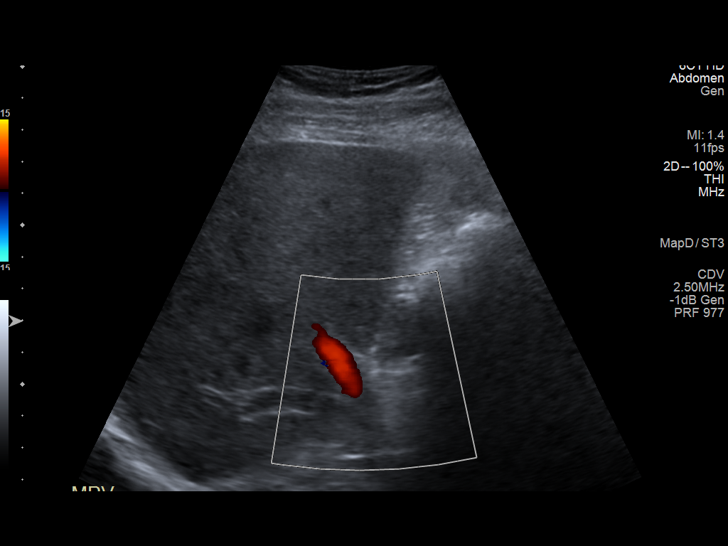

[14 of 25 positions shown; findings below may reference images not displayed]

FINDINGS: Gallbladder:

Not seen consistent with cholecystectomy.

Common bile duct:

Diameter: 2.4 mm

Liver:

There is increased echogenicity in the liver. No focal abnormality
is seen. Portal vein is patent on color Doppler imaging with normal
direction of blood flow towards the liver.

Other: None.
IMPRESSION: Fatty liver. Status post cholecystectomy. Sonographic examination of
right upper quadrant is otherwise unremarkable.

## 2023-03-17 ENCOUNTER — Other Ambulatory Visit: Payer: Self-pay | Admitting: Internal Medicine

## 2023-03-17 DIAGNOSIS — N62 Hypertrophy of breast: Secondary | ICD-10-CM

## 2023-03-24 ENCOUNTER — Ambulatory Visit
Admission: RE | Admit: 2023-03-24 | Discharge: 2023-03-24 | Disposition: A | Discharge status: Home / Self Care | Payer: Medicare Other | Source: Ambulatory Visit | Attending: Internal Medicine | Admitting: Internal Medicine

## 2023-03-24 DIAGNOSIS — N62 Hypertrophy of breast: Secondary | ICD-10-CM | POA: Diagnosis present

## 2023-03-28 ENCOUNTER — Other Ambulatory Visit: Payer: Self-pay | Admitting: Internal Medicine

## 2023-03-28 DIAGNOSIS — C50922 Malignant neoplasm of unspecified site of left male breast: Secondary | ICD-10-CM

## 2023-03-28 DIAGNOSIS — R928 Other abnormal and inconclusive findings on diagnostic imaging of breast: Secondary | ICD-10-CM

## 2023-03-28 DIAGNOSIS — R599 Enlarged lymph nodes, unspecified: Secondary | ICD-10-CM

## 2023-03-28 HISTORY — DX: Malignant neoplasm of unspecified site of left male breast: C50.922

## 2023-03-31 HISTORY — PX: BREAST BIOPSY: SHX20

## 2023-04-01 ENCOUNTER — Ambulatory Visit
Admission: RE | Admit: 2023-04-01 | Discharge: 2023-04-01 | Disposition: A | Discharge status: Home / Self Care | Payer: Medicare Other | Source: Ambulatory Visit | Attending: Internal Medicine | Admitting: Internal Medicine

## 2023-04-01 ENCOUNTER — Other Ambulatory Visit: Payer: Self-pay | Admitting: Internal Medicine

## 2023-04-01 DIAGNOSIS — R599 Enlarged lymph nodes, unspecified: Secondary | ICD-10-CM | POA: Insufficient documentation

## 2023-04-01 DIAGNOSIS — R928 Other abnormal and inconclusive findings on diagnostic imaging of breast: Secondary | ICD-10-CM | POA: Insufficient documentation

## 2023-04-01 HISTORY — PX: BREAST BIOPSY: SHX20

## 2023-04-01 MED ORDER — LIDOCAINE HCL (PF) 1 % IJ SOLN
2.0000 mL | Freq: Once | INTRAMUSCULAR | Status: AC
  Administered 2023-04-01: 2 mL via INTRADERMAL

## 2023-04-01 MED ORDER — LIDOCAINE-EPINEPHRINE 1 %-1:100000 IJ SOLN
8.0000 mL | Freq: Once | INTRAMUSCULAR | Status: AC
  Administered 2023-04-01: 8 mL

## 2023-04-04 ENCOUNTER — Encounter: Payer: Self-pay | Admitting: *Deleted

## 2023-04-04 DIAGNOSIS — C50922 Malignant neoplasm of unspecified site of left male breast: Secondary | ICD-10-CM

## 2023-04-04 NOTE — Progress Notes (Signed)
Received referral for newly diagnosed breast cancer from [facility].  Navigation initiated.  He will see Dr. Orlie Dakin tomorrow and Dr. Tonna Boehringer on Wednesday.

## 2023-04-05 ENCOUNTER — Inpatient Hospital Stay: Payer: Medicare Other | Attending: Oncology | Admitting: Oncology

## 2023-04-05 ENCOUNTER — Encounter: Payer: Self-pay | Admitting: Oncology

## 2023-04-05 ENCOUNTER — Encounter: Payer: Self-pay | Admitting: *Deleted

## 2023-04-05 ENCOUNTER — Inpatient Hospital Stay: Payer: Medicare Other

## 2023-04-05 VITALS — BP 122/75 | HR 68 | Temp 97.5°F | Ht 70.0 in | Wt 202.9 lb

## 2023-04-05 DIAGNOSIS — C50922 Malignant neoplasm of unspecified site of left male breast: Secondary | ICD-10-CM | POA: Diagnosis present

## 2023-04-05 DIAGNOSIS — E119 Type 2 diabetes mellitus without complications: Secondary | ICD-10-CM | POA: Diagnosis not present

## 2023-04-05 DIAGNOSIS — I10 Essential (primary) hypertension: Secondary | ICD-10-CM | POA: Insufficient documentation

## 2023-04-05 DIAGNOSIS — Z803 Family history of malignant neoplasm of breast: Secondary | ICD-10-CM | POA: Diagnosis not present

## 2023-04-05 DIAGNOSIS — Z170 Estrogen receptor positive status [ER+]: Secondary | ICD-10-CM | POA: Diagnosis not present

## 2023-04-05 LAB — SURGICAL PATHOLOGY

## 2023-04-05 NOTE — Progress Notes (Signed)
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  INTERVAL HISTORY: Patient is a 82 year old male who noticed some mild tenderness near his left nipple.  Primary care noted a small lump and subsequent ultrasound biopsy revealed the above-stated malignancy.  He currently feels well.  He has no neurologic complaints.  He denies any recent fevers or illnesses.  He has a good appetite and denies weight loss.  He has no chest pain, shortness of breath, cough, or hemoptysis.  He denies any nausea, vomiting, constipation, or diarrhea.  He has no urinary complaints.  Patient offers no further specific complaints today.  REVIEW OF SYSTEMS:   Review of Systems  Constitutional: Negative.  Negative for fever, malaise/fatigue and weight loss.  Respiratory: Negative.  Negative for cough, hemoptysis and shortness of breath.   Cardiovascular: Negative.  Negative for chest pain and leg swelling.  Gastrointestinal: Negative.  Negative for abdominal pain.  Genitourinary: Negative.  Negative for dysuria.  Musculoskeletal: Negative.  Negative for back pain.  Skin: Negative.  Negative for rash.  Neurological: Negative.  Negative for dizziness, focal weakness, weakness and headaches.  Psychiatric/Behavioral: Negative.  The patient is not nervous/anxious.     As per HPI. Otherwise, a complete review of systems is negative.  PAST MEDICAL HISTORY: Past Medical History:  Diagnosis Date   Chronic back pain    Diabetes mellitus without complication    Hypercholesterolemia    Hyperlipidemia    Hypertension     PAST SURGICAL HISTORY: Past Surgical History:  Procedure Laterality Date    APPENDECTOMY     BREAST BIOPSY Left 04/01/2023   u/s bx, COIL clip-path pending   BREAST BIOPSY Left 03/31/2023   u/s bx axilla-HYDROMARK(coil)   BREAST BIOPSY Left 04/01/2023   [place] LT BREAST BX W LOC DEV 1ST LESION IMG BX SPEC US GUIDE 04/01/2023 [facility]   CHOLECYSTECTOMY     COLONOSCOPY WITH PROPOFOL N/A 09/19/2015   Procedure: COLONOSCOPY WITH PROPOFOL;  Surgeon: Elnita Maxwell, MD;  [facility] END[facility]COPY;  Service: Endoscopy;  Laterality: N/A;    FAMILY HISTORY: Family History  Problem Relation Age of Onset   Breast cancer Mother     ADVANCED DIRECTIVES (Y/N):  N  HEALTH MAINTENANCE: Social History   Tobacco Use   Smoking status: Former   Smokeless tobacco: Never  Substance Use Topics   Alcohol use: No   Drug use: No     Colonoscopy:  PAP:  Bone density:  Lipid panel:  No Known Allergies  Current Outpatient Medications  Medication Sig Dispense Refill   atorvastatin (LIPITOR) 40 MG tablet Take 40 mg by mouth daily.     ketoconazole (NIZORAL) 2 % shampoo Apply 1 Application topically as needed.     aspirin EC 81 MG tablet Take 81 mg by mouth daily.     azelastine (ASTELIN) 0.1 % nasal spray Place 1 spray into both nostrils 2 (two) times daily. Use in each nostril as directed     cyanocobalamin (VITAMIN B12) 1000 MCG tablet Take 1,000 mcg by mouth daily.     lisinopril-hydrochlorothiazide (PRINZIDE,ZESTORETIC) 10-12.5 MG per tablet Take 1 tablet by mouth daily.     metFORMIN (GLUCOPHAGE-XR) 500 MG 24 hr tablet Take  1,000 mg by mouth daily with breakfast. TAKE 2 TABLETS DAILY with SUPPER     TRULICITY 3 MG/0.5ML SOPN Inject 3 mg into the skin once a week.     No current facility-administered medications for this visit.    OBJECTIVE: Vitals:   04/05/23 1451  BP: 122/75  Pulse: 68  Temp: (!) 97.5 F (36.4 C)     Body mass index is 29.11 kg/m.    ECOG FS:0 - Asymptomatic  General: Well-developed, well-nourished, no acute distress. Eyes: Pink  conjunctiva, anicteric sclera. HEENT: Normocephalic, moist mucous membranes. Lungs: No audible wheezing or coughing. Heart: Regular rate and rhythm. Abdomen: Soft, nontender, no obvious distention. Musculoskeletal: No edema, cyanosis, or clubbing. Neuro: Alert, answering all questions appropriately. Cranial nerves grossly intact. Skin: No rashes or petechiae noted. Psych: Normal affect. Lymphatics: No cervical, calvicular, axillary or inguinal LAD.   LAB RESULTS:  No results found for: "NA", "K", "CL", "CO2", "GLUCOSE", "BUN", "CREATININE", "CALCIUM", "PROT", "ALBUMIN", "AST", "ALT", "ALKPHOS", "BILITOT", "GFRNONAA", "GFRAA"  No results found for: "WBC", "NEUTROABS", "HGB", "HCT", "MCV", "PLT"   STUDIES: [place] AXILLARY NODE CORE BIOPSY LEFT  Addendum Date: 04/04/2023   ADDENDUM REPORT: 04/04/2023 14:07 ADDENDUM: PATHOLOGY revealed: Site A. BREAST, LEFT 6:00; ULTRASOUND-GUIDED BIOPSY: - INVASIVE MAMMARY CARCINOMA, NO SPECIAL TYPE, WITH NECROSIS. 13 mm in this sample. Grade 2. Ductal carcinoma in situ: Not identified. Pathology results are CONCORDANT with imaging findings, per Dr. Jacob Moores. PATHOLOGY revealed: Site B. 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 Comment: While the cytologic features of the metastatic carcinoma in part B are similar to the carcinoma sampled in part A definite solid papillary morphology is not appreciated in part A. This likely represents sampling bias. Pathology results are CONCORDANT with imaging findings, per Dr. Jacob Moores. Pathology results and recommendations below were discussed with patient by telephone on 04/04/2023. Patient reported biopsy site within normal limits with slight tenderness at the site. Post biopsy care instructions were reviewed, questions were answered and my direct phone number was provided to patient. Patient was instructed to call  [facility] if any concerns or questions arise related to the biopsy. RECOMMENDATIONS: 1. Surgical and oncological consultation. Request for surgical and oncological consultation relayed to Irving Shows RN at [facility] by Randa Lynn RN on 04/04/2023. Pathology results reported by Randa Lynn RN on 04/04/2023. Electronically Signed   By: Jacob Moores M.D.   On: 04/04/2023 14:07   Result Date: 04/04/2023 CLINICAL DATA:  82 year old male with left breast mass and left axillary lymphadenopathy EXAM: ULTRASOUND GUIDED LEFT BREAST CORE NEEDLE BIOPSY; ULTRASOUND-GUIDED LEFT AXILLARY CORE NEEDLE BIOPSY COMPARISON:  Previous exam(s). PROCEDURE: I met with the patient and we discussed the procedure of ultrasound-guided biopsy, including benefits and alternatives. We discussed the high likelihood of a successful procedure. We discussed the risks of the procedure, including infection, bleeding, tissue injury, clip migration, and inadequate sampling. Informed written consent was given. The usual time-out protocol was performed immediately prior to the procedure. SITE 1: Left breast mass 6 o'clock retroareolar (coil clip) Lesion quadrant: Lower outer quadrant Using sterile technique and 1% Lidocaine as local anesthetic, under direct ultrasound visualization, a 14 gauge spring-loaded device was used to perform biopsy of a mass in the left breast 6 o'clock position using a lateral approach. At the conclusion of the procedure, a coil shaped tissue marker clip was deployed into the biopsy cavity. SITE 2: Left axillary lymph node (  HydroMARK clip) Lesion quadrant: Upper outer quadrant Using sterile technique and 1% Lidocaine as local anesthetic, under direct ultrasound visualization, a 14 gauge spring-loaded device was used to perform biopsy of an enlarged left axillary lymph node using an inferior approach. At the conclusion of the procedure, a HydroMARK tissue marker clip was deployed into the biopsy  cavity. Follow up 2 view mammogram was performed and dictated separately. IMPRESSION: Ultrasound guided biopsy of a left breast mass and a left axillary lymph node. No apparent complications. Electronically Signed: By: Jacob Moores M.D. On: 04/01/2023 09:56   [place] LT BREAST BX W LOC DEV 1ST LESION IMG BX SPEC US GUIDE  Addendum Date: 04/04/2023   ADDENDUM REPORT: 04/04/2023 14:07 ADDENDUM: PATHOLOGY revealed: Site A. BREAST, LEFT 6:00; ULTRASOUND-GUIDED BIOPSY: - INVASIVE MAMMARY CARCINOMA, NO SPECIAL TYPE, WITH NECROSIS. 13 mm in this sample. Grade 2. Ductal carcinoma in situ: Not identified. Pathology results are CONCORDANT with imaging findings, per Dr. Jacob Moores. PATHOLOGY revealed: Site B. 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 Comment: While the cytologic features of the metastatic carcinoma in part B are similar to the carcinoma sampled in part A definite solid papillary morphology is not appreciated in part A. This likely represents sampling bias. Pathology results are CONCORDANT with imaging findings, per Dr. Jacob Moores. Pathology results and recommendations below were discussed with patient by telephone on 04/04/2023. Patient reported biopsy site within normal limits with slight tenderness at the site. Post biopsy care instructions were reviewed, questions were answered and my direct phone number was provided to patient. Patient was instructed to call [facility] if any concerns or questions arise related to the biopsy. RECOMMENDATIONS: 1. Surgical and oncological consultation. Request for surgical and oncological consultation relayed to Irving Shows RN at [facility] by Randa Lynn RN on 04/04/2023. Pathology results reported by Randa Lynn RN on 04/04/2023. Electronically Signed   By: Jacob Moores M.D.   On: 04/04/2023 14:07   Result Date: 04/04/2023 CLINICAL  DATA:  82 year old male with left breast mass and left axillary lymphadenopathy EXAM: ULTRASOUND GUIDED LEFT BREAST CORE NEEDLE BIOPSY; ULTRASOUND-GUIDED LEFT AXILLARY CORE NEEDLE BIOPSY COMPARISON:  Previous exam(s). PROCEDURE: I met with the patient and we discussed the procedure of ultrasound-guided biopsy, including benefits and alternatives. We discussed the high likelihood of a successful procedure. We discussed the risks of the procedure, including infection, bleeding, tissue injury, clip migration, and inadequate sampling. Informed written consent was given. The usual time-out protocol was performed immediately prior to the procedure. SITE 1: Left breast mass 6 o'clock retroareolar (coil clip) Lesion quadrant: Lower outer quadrant Using sterile technique and 1% Lidocaine as local anesthetic, under direct ultrasound visualization, a 14 gauge spring-loaded device was used to perform biopsy of a mass in the left breast 6 o'clock position using a lateral approach. At the conclusion of the procedure, a coil shaped tissue marker clip was deployed into the biopsy cavity. SITE 2: Left axillary lymph node (HydroMARK clip) Lesion quadrant: Upper outer quadrant Using sterile technique and 1% Lidocaine as local anesthetic, under direct ultrasound visualization, a 14 gauge spring-loaded device was used to perform biopsy of an enlarged left axillary lymph node using an inferior approach. At the conclusion of the procedure, a HydroMARK tissue marker clip was deployed into the biopsy cavity. Follow up 2 view mammogram was performed and dictated separately. IMPRESSION: Ultrasound guided biopsy of a left breast mass and a left  axillary lymph node. No apparent complications. Electronically Signed: By: Meghana  Konanur M.D. On: 04/01/2023 09:56   MM CLIJacob MooresP PLACEMENT LEFT  Result Date: 04/01/2023 CLINICAL DATA:  Post-procedure mammogram EXAM: 3D DIAGNOSTIC LEFT MAMMOGRAM POST ULTRASOUND BIOPSY COMPARISON:  Previous exam(s).  FINDINGS: 3D Mammographic images were obtained following ultrasound guided biopsy of a left breast mass in the 6 o'clock position and a left axillary lymph node. The coil shaped biopsy marking clip is in expected position at the site of biopsy in the left breast 6 o'clock position. [facility] shaped biopsy marking clip is in expected position at the site of biopsy in the left axilla. A moderate-sized postprocedure hematoma is noted in the left axilla. This was controlled with manual compression and an Ace bandage. IMPRESSION: Appropriate positioning of the coil and HydroMARK shaped biopsy marking clips at the sites of biopsy in the left breast and left axilla. Final Assessment: Post Procedure Mammograms for Marker Placement Electronically Signed   By: Jacob MooresMeghana  Konanur M.D.   On: 04/01/2023 09:58  MM 3D DIAGNOSTIC MAMMOGRAM BILATERAL BREAST  Result Date: 03/24/2023 CLINICAL DATA:  LEFT breast pain. EXAM: DIGITAL DIAGNOSTIC BILATERAL MAMMOGRAM WITH TOMOSYNTHESIS; ULTRASOUND LEFT BREAST LIMITED TECHNIQUE: Bilateral digital diagnostic mammography and breast tomosynthesis was performed.; Targeted ultrasound examination of the left breast was performed. COMPARISON:  Previous exam(s). ACR Breast Density Category b: There are scattered areas of fibroglandular density. FINDINGS: Diagnostic images were obtained of the LEFT breast. There is a flame shaped focal asymmetry in the LEFT retroareolar breast. However in addition, there is a more focal masslike density along the inferior aspect of the retroareolar breast. It is best seen on spot CC slice 40, ML slice 28, MLO slice 46. No suspicious mass, distortion, or microcalcifications are identified to suggest presence of malignancy in the RIGHT breast. On physical exam, there is asymmetric firmness of the inferior LEFT breast. Targeted ultrasound was performed of the LEFT retroareolar breast. There is a focal hypoechoic masslike area with irregular borders in the 6  o'clock retroareolar breast. It measures approximately 12 by 11 x 10 mm. This corresponds to the masslike area noted mammographically. More planar appearing gynecomastia is noted in the retroareolar breast. Physical exam was performed of the LEFT axilla. There is a mobile mass appreciated in the LEFT axilla. Ultrasound was subsequently performed. There is a markedly enlarged axillary lymph node with internal cystic foci most consistent with necrosis. There is a second enlarged axillary lymph node immediately adjacent. Cortical thickness is at least 16 mm. No discrete echogenic hila are noted. Several benign appearing LEFT axillary lymph nodes are noted superiorly to these 2 adjacent lymph nodes. IMPRESSION: 1. There are 2 suspicious LEFT axillary lymph nodes. 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 Recommend ultrasound-guided biopsy for definitive characterization to evaluate for metastatic disease versus lymphoproliferative disorder. 2. There is a 12 mm mass in the LEFT breast at 6 o'clock which is indeterminate. While this may reflect nodular gynecomastia, malignancy is not excluded. Recommend LEFT breast ultrasound-guided biopsy. 3. No mammographic evidence of malignancy in the RIGHT breast. RECOMMENDATION: LEFT breast ultrasound-guided biopsy x2 I have discussed the findings and recommendations with the patient. The biopsy procedure was discussed with the patient and questions were answered. Patient expressed their understanding of the biopsy recommendation. Patient will be scheduled for biopsy at her earliest convenience by the schedulers. Ordering provider will be notified. If applicable, a reminder letter will be sent to the patient regarding the next appointment. BI-RADS CATEGORY  5: Highly suggestive of malignancy. Electronically Signed   By: Meda Klinefelter M.D.   On: 03/24/2023 10:44  [place] LIMITED ULTRASOUND INCLUDING AXILLA LEFT BREAST   Result Date: 03/24/2023 CLINICAL DATA:   LEFT breast pain. EXAM: DIGITAL DIAGNOSTIC BILATERAL MAMMOGRAM WITH TOMOSYNTHESIS; ULTRASOUND LEFT BREAST LIMITED TECHNIQUE: Bilateral digital diagnostic mammography and breast tomosynthesis was performed.; Targeted ultrasound examination of the left breast was performed. COMPARISON:  Previous exam(s). ACR Breast Density Category b: There are scattered areas of fibroglandular density. FINDINGS: Diagnostic images were obtained of the LEFT breast. There is a flame shaped focal asymmetry in the LEFT retroareolar breast. However in addition, there is a more focal masslike density along the inferior aspect of the retroareolar breast. It is best seen on spot CC slice 40, ML slice 28, MLO slice 46. No suspicious mass, distortion, or microcalcifications are identified to suggest presence of malignancy in the RIGHT breast. On physical exam, there is asymmetric firmness of the inferior LEFT breast. Targeted ultrasound was performed of the LEFT retroareolar breast. There is a focal hypoechoic masslike area with irregular borders in the 6 o'clock retroareolar breast. It measures approximately 12 by 11 x 10 mm. This corresponds to the masslike area noted mammographically. More planar appearing gynecomastia is noted in the retroareolar breast. Physical exam was performed of the LEFT axilla. There is a mobile mass appreciated in the LEFT axilla. Ultrasound was subsequently performed. There is a markedly enlarged axillary lymph node with internal cystic foci most consistent with necrosis. There is a second enlarged axillary lymph node immediately adjacent. Cortical thickness is at least 16 mm. No discrete echogenic hila are noted. Several benign appearing LEFT axillary lymph nodes are noted superiorly to these 2 adjacent lymph nodes. IMPRESSION: 1. There are 2 suspicious LEFT axillary lymph nodes. 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 Recommend ultrasound-guided biopsy for definitive characterization to  evaluate for metastatic disease versus lymphoproliferative disorder. 2. There is a 12 mm mass in the LEFT breast at 6 o'clock which is indeterminate. While this may reflect nodular gynecomastia, malignancy is not excluded. Recommend LEFT breast ultrasound-guided biopsy. 3. No mammographic evidence of malignancy in the RIGHT breast. RECOMMENDATION: LEFT breast ultrasound-guided biopsy x2 I have discussed the findings and recommendations with the patient. The biopsy procedure was discussed with the patient and questions were answered. Patient expressed their understanding of the biopsy recommendation. Patient will be scheduled for biopsy at her earliest convenience by the schedulers. Ordering provider will be notified. If applicable, a reminder letter will be sent to the patient regarding the next appointment. BI-RADS CATEGORY  5: Highly suggestive of malignancy. Electronically Signed   By: Meda Klinefelter M.D.   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  Patient has consultation with surgery tomorrow and have recommended patient pursue lumpectomy or mastectomy with axillary lymph node sampling as his first treatment option.  Adjuvant chemotherapy will likely be unnecessary, but will send Oncotype Dx score on his pathology for completeness.  If patient undergoes lumpectomy, he likely will benefit from adjuvant XRT.  Finally, given the ER/PR positivity of his tumor patient will benefit from tamoxifen for a total of 5 years at the conclusion of his treatments.  A referral has also been sent for genetic testing.  Return to clinic 2 weeks  after surgery to discuss his final pathology results and treatment planning.  Patient will also have consultation with radiation oncology on that day.  I  spent a total of 60 minutes reviewing chart data, face-to-face evaluation with the patient, counseling and coordination of care as detailed above.   Patient expressed understanding and was in agreement with this plan. He also understands that He can call clinic at any time with any questions, concerns, or complaints.    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

## 2023-04-05 NOTE — Progress Notes (Signed)
Accompanied patient and family to initial medical oncology appointment.   Reviewed Breast Cancer treatment handbook.   Care plan summary given to patient.   Reviewed outreach programs and cancer center services.   

## 2023-04-11 ENCOUNTER — Other Ambulatory Visit: Payer: Self-pay | Admitting: Surgery

## 2023-04-11 DIAGNOSIS — N6311 Unspecified lump in the right breast, upper outer quadrant: Secondary | ICD-10-CM

## 2023-04-12 ENCOUNTER — Inpatient Hospital Stay: Payer: Medicare Other

## 2023-04-12 ENCOUNTER — Inpatient Hospital Stay (HOSPITAL_BASED_OUTPATIENT_CLINIC_OR_DEPARTMENT_OTHER): Payer: Medicare Other | Admitting: Licensed Clinical Social Worker

## 2023-04-12 DIAGNOSIS — Z803 Family history of malignant neoplasm of breast: Secondary | ICD-10-CM | POA: Diagnosis not present

## 2023-04-12 DIAGNOSIS — C50922 Malignant neoplasm of unspecified site of left male breast: Secondary | ICD-10-CM

## 2023-04-12 NOTE — Progress Notes (Signed)
REFERRING PROVIDER: Jeralyn Ruths, MD [address] [place],  [place] [address]  PRIMARY PROVIDER:  Lauro Regulus, MD  PRIMARY REASON FOR VISIT:  1. 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 Family history of breast cancer      HISTORY OF PRESENT ILLNESS:   Juan Calhoun, a 82 y.o. male, was seen for a [place] cancer genetics consultation at the request of Dr. Orlie Dakin due to a personal and family history of breast cancer.  Juan Calhoun presents to clinic today to discuss the possibility of a hereditary predisposition to cancer, genetic testing, and to further clarify his future cancer risks, as well as potential cancer risks for family members.   CANCER HISTORY:  In 2024, at the age of 14, Juan Calhoun was diagnosed with invasive ductal carcinoma of the left breast, ER/PR+ HER2-. The treatment plan includes left mastectomy, this has not yet been scheduled, Oncotype Dx, adjuvant antiestrogen therapy.  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   04/05/2023 Cancer Staging   Staging form: Breast, AJCC 8th Edition - Clinical stage from 04/05/2023: Stage IB (cT1c, cN1, cM0, G2, ER+, PR+, HER2-) - Signed by Jeralyn Ruths, MD on 04/05/2023 Histologic grading system: 3 grade system     Past Medical History:  Diagnosis Date   Chronic back pain    Diabetes mellitus without complication    Hypercholesterolemia    Hyperlipidemia    Hypertension     Past Surgical History:  Procedure Laterality Date   APPENDECTOMY     BREAST BIOPSY Left 04/01/2023   u/s bx, COIL clip-path pending   BREAST BIOPSY Left 03/31/2023   u/s bx axilla-HYDROMARK(coil)   BREAST BIOPSY Left 04/01/2023   [place] LT BREAST BX W LOC DEV 1ST LESION IMG BX SPEC US GUIDE 04/01/2023 [facility]   CHOLECYSTECTOMY     COLONOSCOPY WITH PROPOFOL N/A 09/19/2015   Procedure: COLONOSCOPY WITH PROPOFOL;   Surgeon: Elnita Maxwell, MD;  Location: [facility] ENDOSCOPY;  Service: Endoscopy;  Laterality: N/A;    FAMILY HISTORY:  We obtained a detailed, 4-generation family history.  Significant diagnoses are listed below: Family History  Problem Relation Age of Onset   Breast cancer Mother    Heart disease Father    Heart attack Brother    Heart disease Maternal Uncle    Juan Calhoun has 2 sons and 1 daughter. His son had a breast tumor several years ago that was found to be benign. Juan Calhoun also had 1 brother and 1 sister, no cancers.  Juan Calhoun mother had breast cancer at 23 and passed from it. No other known cancers on this side of the family.  Juan Calhoun father died at 38 of heart issues. Paternal grandmother likely had cancer, unsure type. No other known cancers on this side of the family.  Juan Calhoun is unaware of previous family history of genetic testing for hereditary cancer risks. There is no reported Ashkenazi Jewish ancestry. There is no known consanguinity.    GENETIC COUNSELING ASSESSMENT: Juan Calhoun is a 82 y.o. male with a personal and family history of breast cancer which is somewhat suggestive of a hereditary cancer syndrome and predisposition to cancer. We, therefore, discussed and recommended the following at today's visit.   DISCUSSION: We discussed that approximately 10% of breast cancer is hereditary. Most cases of hereditary breast cancer are associated with BRCA1/BRCA2 genes, although  there are other genes associated with hereditary cancer as well. Cancers and risks are gene specific. We discussed that testing is beneficial for several reasons including knowing about cancer risks, identifying potential screening and risk-reduction options that may be appropriate, and to understand if other family members could be at risk for cancer and allow them to undergo genetic testing.   We reviewed the characteristics, features and inheritance patterns of hereditary cancer  syndromes. We also discussed genetic testing, including the appropriate family members to test, the process of testing, insurance coverage and turn-around-time for results. We discussed the implications of a negative, positive and/or variant of uncertain significant result. We recommended Juan Calhoun pursue genetic testing for the [facility] Multi-Cancer+RNA gene panel.   Based on Juan Calhoun personal and family history of cancer, he meets medical criteria for genetic testing. Despite that he meets criteria, he may still have an out of pocket cost. We discussed that if his out of pocket cost for testing is over $100, the laboratory will call and confirm whether he wants to proceed with testing.  If the out of pocket cost of testing is less than $100 he will be billed by the genetic testing laboratory.   PLAN: After considering the risks, benefits, and limitations, Mr. Dupuy provided informed consent to pursue genetic testing and the blood sample was sent to [facility] for analysis of the Multi-Cancer+RNA panel. Results should be available within approximately 2-3 weeks' time, at which point they will be disclosed by telephone to Juan Calhoun, as will any additional recommendations warranted by these results. Juan Calhoun will receive a summary of his genetic counseling visit and a copy of his results once available. This information will also be available in Epic.   Juan Calhoun questions were answered to his satisfaction today. Our contact information was provided should additional questions or concerns arise. Thank you for the referral and allowing [place] to share in the care of your patient.   Lacy Duverney, MS, [facility] Genetic Counselor [email or web address] Phone: [phone]  The patient was seen for a total of 20 minutes in face-to-face genetic counseling. Patient's wife and son were also present.  Dr. Orlie Dakin was available for discussion regarding this case.    _______________________________________________________________________ For Office Staff:  Number of people involved in session: 3 Was an Intern/ student involved with case: no

## 2023-04-13 ENCOUNTER — Inpatient Hospital Stay (HOSPITAL_BASED_OUTPATIENT_CLINIC_OR_DEPARTMENT_OTHER): Payer: Medicare Other | Admitting: Hospice and Palliative Medicine

## 2023-04-13 DIAGNOSIS — C50922 Malignant neoplasm of unspecified site of left male breast: Secondary | ICD-10-CM

## 2023-04-13 NOTE — Progress Notes (Signed)
Multidisciplinary Oncology Council Documentation  Juan Calhoun was presented by our [facility] on 04/13/2023, which included representatives from:  Palliative Care Dietitian  Physical/Occupational Therapist Nurse Navigator Genetics Speech Therapist Social work Survivorship RN Financial Navigator Research RN   Juan Calhoun currently presents with history of breast cancer  We reviewed previous medical and familial history, history of present illness, and recent lab results along with all available histopathologic and imaging studies. The MOC considered available treatment options and made the following recommendations/referrals:  None currently, consider rehab screening  The MOC is a meeting of clinicians from various specialty areas who evaluate and discuss patients for whom a multidisciplinary approach is being considered. Final determinations in the plan of care are those of the provider(s).   Today's extended care, comprehensive team conference, Juan Calhoun was not present for the discussion and was not examined.

## 2023-04-14 ENCOUNTER — Ambulatory Visit
Admission: RE | Admit: 2023-04-14 | Discharge: 2023-04-14 | Disposition: A | Discharge status: Home / Self Care | Payer: Medicare Other | Source: Ambulatory Visit | Attending: Surgery | Admitting: Surgery

## 2023-04-14 ENCOUNTER — Other Ambulatory Visit: Payer: Self-pay | Admitting: Surgery

## 2023-04-14 DIAGNOSIS — N6311 Unspecified lump in the right breast, upper outer quadrant: Secondary | ICD-10-CM | POA: Insufficient documentation

## 2023-04-14 DIAGNOSIS — N63 Unspecified lump in unspecified breast: Secondary | ICD-10-CM

## 2023-04-14 DIAGNOSIS — R928 Other abnormal and inconclusive findings on diagnostic imaging of breast: Secondary | ICD-10-CM

## 2023-04-20 ENCOUNTER — Other Ambulatory Visit: Payer: Medicare Other

## 2023-04-20 ENCOUNTER — Ambulatory Visit
Admission: RE | Admit: 2023-04-20 | Discharge: 2023-04-20 | Disposition: A | Discharge status: Home / Self Care | Payer: Medicare Other | Source: Ambulatory Visit | Attending: Surgery | Admitting: Surgery

## 2023-04-20 ENCOUNTER — Other Ambulatory Visit: Payer: Self-pay | Admitting: Surgery

## 2023-04-20 DIAGNOSIS — N63 Unspecified lump in unspecified breast: Secondary | ICD-10-CM | POA: Insufficient documentation

## 2023-04-20 DIAGNOSIS — R928 Other abnormal and inconclusive findings on diagnostic imaging of breast: Secondary | ICD-10-CM | POA: Insufficient documentation

## 2023-04-20 HISTORY — PX: BREAST BIOPSY: SHX20

## 2023-04-20 MED ORDER — LIDOCAINE-EPINEPHRINE 1 %-1:100000 IJ SOLN
8.0000 mL | Freq: Once | INTRAMUSCULAR | Status: DC
  Filled 2023-04-20: qty 8

## 2023-04-20 MED ORDER — LIDOCAINE HCL (PF) 1 % IJ SOLN
2.0000 mL | Freq: Once | INTRAMUSCULAR | Status: DC
  Filled 2023-04-20: qty 2

## 2023-04-26 ENCOUNTER — Telehealth: Payer: Self-pay | Admitting: Licensed Clinical Social Worker

## 2023-04-26 ENCOUNTER — Encounter: Payer: Self-pay | Admitting: Licensed Clinical Social Worker

## 2023-04-26 ENCOUNTER — Ambulatory Visit: Payer: Self-pay | Admitting: Licensed Clinical Social Worker

## 2023-04-26 DIAGNOSIS — Z1379 Encounter for other screening for genetic and chromosomal anomalies: Secondary | ICD-10-CM

## 2023-04-26 NOTE — Progress Notes (Signed)
HPI:   [name] was previously seen in the [place] [facility] clinic due to a personal and family history of cancer and concerns regarding a hereditary predisposition to cancer. Please refer to our prior cancer genetics clinic note for more information regarding our discussion, assessment and recommendations, at the time. [name] recent genetic test results were disclosed to him, as were recommendations warranted by these results. These results and recommendations are discussed in more detail below.  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 No pathogenic variants identified on the [facility] Multi-Cancer+RNA panel. The report date is 04/20/2023.  The Multi-Cancer + RNA Panel offered by [facility] includes sequencing and/or deletion/duplication analysis of the following 70 genes:  AIP*, ALK, APC*, ATM*, AXIN2*, BAP1*, BARD1*, BLM*, BMPR1A*, BRCA1*, BRCA2*, BRIP1*, CDC73*, CDH1*, CDK4, CDKN1B*, CDKN2A, CHEK2*, CTNNA1*, DICER1*, EPCAM, EGFR, FH*, FLCN*, GREM1, HOXB13, KIT, LZTR1, MAX*, MBD4, MEN1*, MET, MITF, MLH1*, MSH2*, MSH3*, MSH6*, MUTYH*, NF1*, NF2*, NTHL1*, PALB2*, PDGFRA, PMS2*, POLD1*, POLE*, POT1*, PRKAR1A*, PTCH1*, PTEN*, RAD51C*, RAD51D*, RB1*, RET, SDHA*, SDHAF2*, SDHB*, SDHC*, SDHD*, SMAD4*, SMARCA4*, SMARCB1*, SMARCE1*, STK11*, SUFU*, TMEM127*, TP53*, TSC1*, TSC2*, VHL*. RNA analysis is performed for * genes.     FAMILY HISTORY:  We obtained a detailed, 4-generation family history.  Significant diagnoses are listed below: Family History  Problem Relation Age of Onset    Breast cancer Mother    Heart disease Father    Heart attack Brother    Heart disease Maternal Uncle    [name] has 2 sons and 1 daughter. His son had a breast tumor several years ago that was found to be benign. [name] also had 1 brother and 1 sister, no cancers.   [name] mother had breast cancer at 64 and passed from it. No other known cancers on this side of the family.   [name] father died at 20 of heart issues. Paternal grandmother likely had cancer, unsure type. No other known cancers on this side of the family.   [name] is unaware of previous family history of genetic testing for hereditary cancer risks. There is no reported Ashkenazi Jewish ancestry. There is no known consanguinity.      GENETIC TEST RESULTS:  The [facility] Multi-Cancer+RNA Panel found no pathogenic mutations.   The Multi-Cancer + RNA Panel offered by [facility] includes sequencing and/or deletion/duplication analysis of the following 70 genes:  AIP*, ALK, APC*, ATM*, AXIN2*, BAP1*, BARD1*, BLM*, BMPR1A*, BRCA1*, BRCA2*, BRIP1*, CDC73*, CDH1*, CDK4, CDKN1B*, CDKN2A, CHEK2*, CTNNA1*, DICER1*, EPCAM, EGFR, FH*, FLCN*, GREM1, HOXB13, KIT, LZTR1, MAX*, MBD4, MEN1*, MET, MITF, MLH1*, MSH2*, MSH3*, MSH6*, MUTYH*, NF1*, NF2*, NTHL1*, PALB2*, PDGFRA, PMS2*, POLD1*, POLE*, POT1*, PRKAR1A*, PTCH1*, PTEN*, RAD51C*, RAD51D*, RB1*, RET, SDHA*, SDHAF2*, SDHB*, SDHC*, SDHD*, SMAD4*, SMARCA4*, SMARCB1*, SMARCE1*, STK11*, SUFU*, TMEM127*, TP53*, TSC1*, TSC2*, VHL*. RNA analysis is performed for * genes.   The test report has been scanned into EPIC and is located under the Molecular Pathology section of the Results Review tab.  A portion of the result report is included below for reference. Genetic testing reported out on 04/20/2023.  Even though a pathogenic variant was not identified, possible explanations for the cancer in the family may include: There may be no hereditary risk for cancer in the family. The  cancers in [name] and/or his family may be sporadic/familial or due to other genetic and environmental factors. There may be a gene mutation in one of these genes that current testing methods cannot detect but that chance is small. There could be another gene that has not yet been discovered, or that we have not yet tested, that is responsible for the cancer diagnoses in the family.  It is also possible there is a hereditary cause for the cancer in the family that [name] did not inherit.  Therefore, it is important to remain in touch with cancer genetics in the future so that we can continue to offer [name] the most up to date genetic testing.   ADDITIONAL GENETIC TESTING:  We discussed with [name] that his genetic testing was fairly extensive.  If there are additional relevant genes identified to increase cancer risk that can be analyzed in the future, we would be happy to discuss and coordinate this testing at that time.    CANCER SCREENING RECOMMENDATIONS:  [name] test result is considered negative (normal).  This means that we have not identified a hereditary cause for his personal and family history of cancer at this time.   An individual's cancer risk and medical management are not determined by genetic test results alone. Overall cancer risk assessment incorporates additional factors, including personal medical history, family history, and any available genetic information that may result in a personalized plan for cancer prevention and surveillance. Therefore, it is recommended he continue to follow the cancer management and screening guidelines provided by his oncology and primary healthcare provider.  RECOMMENDATIONS FOR FAMILY MEMBERS:   Since he did not inherit a identifiable mutation in a cancer predisposition gene included on this panel, his children could not have inherited a known mutation from him in one of these genes. Individuals in this family might be at  some increased risk of developing cancer, over the general population risk, due to the family history of cancer.  Individuals in the family should notify their providers of the family history of cancer. We recommend women in this family have a yearly mammogram beginning at age 78, or 53 years younger than the earliest onset of cancer, an annual clinical breast exam, and perform monthly breast self-exams.  Family members should have colonoscopies by at age 58, or earlier, as recommended by their providers.   FOLLOW-UP:  Lastly, we discussed with [name] that cancer genetics is a rapidly advancing field and it is possible that new genetic tests will be appropriate for him and/or his family members in the future. We encouraged him to remain in contact with cancer genetics on an annual basis so we can update his personal and family histories and let him know of advances in cancer genetics that may benefit this family.   Our contact number was provided. [name] questions were answered to his satisfaction, and he knows he is welcome to call us at anytime with additional questions or concerns.    Lacy Duverney, MS, [facility] Genetic Counselor [email or web address] Phone: [phone]

## 2023-04-26 NOTE — Telephone Encounter (Signed)
I contacted Mr. Henard to discuss his genetic testing results. No pathogenic variants were identified in the 70 genes analyzed. Detailed clinic note to follow.   The test report has been scanned into EPIC and is located under the Molecular Pathology section of the Results Review tab.  A portion of the result report is included below for reference.       Lacy Duverney, MS, [facility] Genetic Counselor [email or web address] Phone: [phone]

## 2023-04-27 ENCOUNTER — Other Ambulatory Visit: Payer: Self-pay | Admitting: Surgery

## 2023-04-27 ENCOUNTER — Encounter: Payer: Self-pay | Admitting: *Deleted

## 2023-04-27 ENCOUNTER — Inpatient Hospital Stay: Payer: Medicare Other | Attending: Oncology | Admitting: Occupational Therapy

## 2023-04-27 DIAGNOSIS — Z170 Estrogen receptor positive status [ER+]: Secondary | ICD-10-CM | POA: Insufficient documentation

## 2023-04-27 DIAGNOSIS — C50122 Malignant neoplasm of central portion of left male breast: Secondary | ICD-10-CM

## 2023-04-27 DIAGNOSIS — Z87891 Personal history of nicotine dependence: Secondary | ICD-10-CM | POA: Insufficient documentation

## 2023-04-27 DIAGNOSIS — Z803 Family history of malignant neoplasm of breast: Secondary | ICD-10-CM | POA: Insufficient documentation

## 2023-04-27 DIAGNOSIS — C50922 Malignant neoplasm of unspecified site of left male breast: Secondary | ICD-10-CM | POA: Insufficient documentation

## 2023-04-27 NOTE — Progress Notes (Unsigned)
Spoke with Juan Calhoun during his screening with OT.  Answered his questions to his satisfaction.   Awaiting surgery to be scheduled and then will get him scheduled with Dr. Orlie Dakin after his surgery.

## 2023-04-27 NOTE — Therapy (Signed)
[facility] [address], [address] [place], [place], [address] Phone: [phone]   Fax:  [phone]  Occupational Therapy Screen:  Patient Details  Name: Juan Calhoun MRN: [id number] Date of Birth: [birth year] No data recorded  Encounter Date: 04/27/2023   OT End of Session - 04/27/23 1059     Visit Number 0             Past Medical History:  Diagnosis Date   Chronic back pain    Diabetes mellitus without complication (HCC)    Hypercholesterolemia    Hyperlipidemia    Hypertension     Past Surgical History:  Procedure Laterality Date   APPENDECTOMY     BREAST BIOPSY Left 04/01/2023   u/s bx, COIL clip- [facility]   BREAST BIOPSY Left 03/31/2023   u/s bx axilla-HYDROMARK(coil) METASTATIC   BREAST BIOPSY Left 04/01/2023   [place] LT BREAST BX W LOC DEV 1ST LESION IMG BX SPEC US GUIDE 04/01/2023 [facility]   BREAST BIOPSY Right 04/20/2023   [place] RT BREAST BX W LOC DEV 1ST LESION IMG BX SPEC US GUIDE 04/20/2023 [facility]   CHOLECYSTECTOMY     COLONOSCOPY WITH PROPOFOL N/A 09/19/2015   Procedure: COLONOSCOPY WITH PROPOFOL;  Surgeon: Elnita Maxwell, MD;  Location: [facility] ENDOSCOPY;  Service: Endoscopy;  Laterality: N/A;    There were no vitals filed for this visit.   Subjective Assessment - 04/27/23 1056     Subjective  I seen the  surgeon and recommend mastectomy and some of my lymphnodes in armpit- had my imaging- they could not do biopsy - need to get with Dr Orlie Dakin about lumpectomy/radiation or mastectomy - I am leaning towards mastectomy    Currently in Pain? No/denies                 LYMPHEDEMA/ONCOLOGY QUESTIONNAIRE - 04/27/23 0001       Right Upper Extremity Lymphedema   15 cm Proximal to Olecranon Process 30.4 cm    10 cm Proximal to Olecranon Process 28 cm    Olecranon Process 27 cm    15 cm Proximal to Ulnar Styloid Process 26 cm    10 cm Proximal to Ulnar Styloid Process 22 cm    Just  Proximal to Ulnar Styloid Process 18.5 cm    Across Hand at [facility] 22 cm      Left Upper Extremity Lymphedema   15 cm Proximal to Olecranon Process 29 cm    10 cm Proximal to Olecranon Process 27.3 cm    Olecranon Process 26.5 cm    15 cm Proximal to Ulnar Styloid Process 24.5 cm    10 cm Proximal to Ulnar Styloid Process 21 cm    Just Proximal to Ulnar Styloid Process 18.4 cm    Across Hand at [facility] 22.4 cm             04/11/23 Dr Tonna Boehringer visit note: Assessment:  Malignant neoplasm of central portion of left breast in male, estrogen receptor positive (CMS/HHS-HCC) [O96.295, Z17.0] Due to concerns of additional malignancy based on exam today on the left as well as overall size and location of biopsied tumor, recommend proceeding with mastectomy and lymph node dissection. We specifically discussed how extent of dissection likely will be similar to a full axillary dissection due to size of lymph node already involved.  Plan:   1. Malignant neoplasm of central portion of left breast in male, estrogen receptor positive (  CMS/HHS-HCC) [U27.253, Z17.0] Discussed the risk of surgery including recurrence, chronic pain, post-op infxn, poor/delayed wound healing, poor cosmesis, seroma, hematoma formation, and possible re-operation to address said risks. The risks of general anesthetic, if used, includes MI, CVA, sudden death or even reaction to anesthetic medications also discussed. Typical post-op recovery time and possbility of activity restrictions were also discussed. Alternatives include continued observation. Benefits include possible symptom relief, pathologic evaluation, and/or curative excision.  The patient verbalized understanding and all questions were answered to the patient's satisfaction.  2. Patient has elected to proceed with surgical treatment. Procedure will be scheduled. Left mastectomy, axillary lymph node dissection, AFTER [place] assessment of right breast mass  palpated on exam today. Case discussed with Dr. Kerby Nora and she stated US examination appropriate for a palpable mass.  Once [place] clears of any pathology on right, will proceed with scheduling surgery for known left breast Cancer.     OT NOTE 04/27/23:  Patient arrive for OT screen for information about recovery after mastectomy lymphedema and rehab. Patient had imaging done and surgery and recommended mastectomy with lymph node dissection on the left. Asked for breast navigator to come and answer few of patient's questions. Bilateral circumference of upper extremity was taken as well as active range of motion is within normal limits. Some shoulder stiffness but no pain. Patient was educated in lymphedema anatomy, sign and symptoms and prevention. Handout provided that was in the cancer journal. Patient scheduled for 05/05/2023 left mastectomy. Patient will follow-up with me 3 weeks after surgery.                         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

## 2023-04-28 ENCOUNTER — Encounter: Payer: Self-pay | Admitting: *Deleted

## 2023-04-28 NOTE — Progress Notes (Signed)
Mastectomy is scheduled for 5/9, he will see Marisue Humble on 5/22 and Dr. Orlie Dakin on 5/31.

## 2023-04-29 ENCOUNTER — Encounter
Admission: RE | Admit: 2023-04-29 | Discharge: 2023-04-29 | Disposition: A | Discharge status: Home / Self Care | Payer: Medicare Other | Source: Ambulatory Visit | Attending: Surgery | Admitting: Surgery

## 2023-04-29 VITALS — Ht 71.0 in | Wt 195.0 lb

## 2023-04-29 DIAGNOSIS — Z01812 Encounter for preprocedural laboratory examination: Secondary | ICD-10-CM

## 2023-04-29 DIAGNOSIS — I10 Essential (primary) hypertension: Secondary | ICD-10-CM

## 2023-04-29 HISTORY — DX: Cortical age-related cataract, unspecified eye: H25.019

## 2023-04-29 HISTORY — DX: Residual hemorrhoidal skin tags: K64.4

## 2023-04-29 HISTORY — DX: Polyp of colon: K63.5

## 2023-04-29 HISTORY — DX: Chronic kidney disease, stage 3 unspecified: N18.30

## 2023-04-29 HISTORY — DX: Unspecified osteoarthritis, unspecified site: M19.90

## 2023-04-29 NOTE — Patient Instructions (Addendum)
Your procedure is scheduled on: Thursday, May 9 Report to the Registration Desk on the 1st floor of the [facility] To find out your arrival time, please call [phone] between 1PM - 3PM on: Wednesday, May 8 If your arrival time is 6:00 am, do not arrive before that time as the [facility] entrance doors do not open until 6:00 am.  REMEMBER: Instructions that are not followed completely may result in serious medical risk, up to and including death; or upon the discretion of your surgeon and anesthesiologist your surgery may need to be rescheduled.  Do not eat food after midnight the night before surgery.  No gum chewing or hard candies.  You may however, drink water up to 2 hours before you are scheduled to arrive for your surgery. Do not drink anything within 2 hours of your scheduled arrival time.  One week prior to surgery: starting today, May 3 Stop Anti-inflammatories (NSAIDS) such as Advil, Aleve, Ibuprofen, Motrin, Naproxen, Naprosyn and Aspirin based products such as Excedrin, Goody's Powder, BC Powder. Stop ANY OVER THE COUNTER supplements until after surgery. You may however, continue to take Tylenol if needed for pain up until the day of surgery.  Continue taking all prescribed medications with the exception of the following:  Trulicity hold for 7 days before surgery.  Do not take dose on Sunday, May 5. Resume on your regular day Sunday, May 12.  TAKE ONLY THESE MEDICATIONS THE MORNING OF SURGERY WITH A SIP OF WATER:  Atorvastatin (Lipitor)  No Alcohol for 24 hours before or after surgery.  No Smoking including e-cigarettes for 24 hours before surgery.  No chewable tobacco products for at least 6 hours before surgery.  No nicotine patches on the day of surgery.  Do not use any "recreational" drugs for at least a week (preferably 2 weeks) before your surgery.  Please be advised that the combination of cocaine and anesthesia may have negative outcomes, up to and  including death. If you test positive for cocaine, your surgery will be cancelled.  On the morning of surgery brush your teeth with toothpaste and water, you may rinse your mouth with mouthwash if you wish. Do not swallow any toothpaste or mouthwash.  Use CHG Soap as directed on instruction sheet.  Do not wear jewelry, make-up, hairpins, clips or nail polish.  Do not wear lotions, powders, or perfumes.   Do not shave body hair from the neck down 48 hours before surgery.  Contact lenses, hearing aids and dentures may not be worn into surgery.  Do not bring valuables to the hospital. [facility] is not responsible for any missing/lost belongings or valuables.   Notify your doctor if there is any change in your medical condition (cold, fever, infection).  Wear comfortable clothing (specific to your surgery type) to the hospital.  After surgery, you can help prevent lung complications by doing breathing exercises.  Take deep breaths and cough every 1-2 hours. Your doctor may order a device called an Incentive Spirometer to help you take deep breaths.  If you are being admitted to the hospital overnight, leave your suitcase in the car. After surgery it may be brought to your room.  In case of increased patient census, it may be necessary for you, the patient, to continue your postoperative care in the Same Day Surgery department.  If you are being discharged the day of surgery, you will not be allowed to drive home. You will need a responsible individual to drive  you home and stay with you for 24 hours after surgery.   If you are taking public transportation, you will need to have a responsible individual with you.  Please call the Pre-admissions Testing Dept. at [phone] if you have any questions about these instructions.  Surgery Visitation Policy:  Patients having surgery or a procedure may have two visitors.  Children under the age of 32 must have an adult with them who is  not the patient.  Inpatient Visitation:    Visiting hours are 7 a.m. to 8 p.m. Up to four visitors are allowed at one time in a patient room. The visitors may rotate out with other people during the day.  One visitor age 32 or older may stay with the patient overnight and must be in the room by 8 p.m.     Preparing for Surgery with CHLORHEXIDINE GLUCONATE (CHG) Soap  Chlorhexidine Gluconate (CHG) Soap  o An antiseptic cleaner that kills germs and bonds with the skin to continue killing germs even after washing  o Used for showering the night before surgery and morning of surgery  Before surgery, you can play an important role by reducing the number of germs on your skin.  CHG (Chlorhexidine gluconate) soap is an antiseptic cleanser which kills germs and bonds with the skin to continue killing germs even after washing.  Please do not use if you have an allergy to CHG or antibacterial soaps. If your skin becomes reddened/irritated stop using the CHG.  1. Shower the NIGHT BEFORE SURGERY and the MORNING OF SURGERY with CHG soap.  2. If you choose to wash your hair, wash your hair first as usual with your normal shampoo.  3. After shampooing, rinse your hair and body thoroughly to remove the shampoo.  4. Use CHG as you would any other liquid soap. You can apply CHG directly to the skin and wash gently with a scrungie or a clean washcloth.  5. Apply the CHG soap to your body only from the neck down. Do not use on open wounds or open sores. Avoid contact with your eyes, ears, mouth, and genitals (private parts). Wash face and genitals (private parts) with your normal soap.  6. Wash thoroughly, paying special attention to the area where your surgery will be performed.  7. Thoroughly rinse your body with warm water.  8. Do not shower/wash with your normal soap after using and rinsing off the CHG soap.  9. Pat yourself dry with a clean towel.  10. Wear clean pajamas to bed the night  before surgery.  12. Place clean sheets on your bed the night of your first shower and do not sleep with pets.  13. Shower again with the CHG soap on the day of surgery prior to arriving at the hospital.  14. Do not apply any deodorants/lotions/powders.  15. Please wear clean clothes to the hospital.

## 2023-05-03 ENCOUNTER — Other Ambulatory Visit: Payer: Medicare Other

## 2023-05-03 ENCOUNTER — Encounter
Admission: RE | Admit: 2023-05-03 | Discharge: 2023-05-03 | Disposition: A | Discharge status: Home / Self Care | Payer: Medicare Other | Source: Ambulatory Visit | Attending: Cardiology | Admitting: Cardiology

## 2023-05-03 ENCOUNTER — Ambulatory Visit
Admission: RE | Admit: 2023-05-03 | Discharge: 2023-05-03 | Disposition: A | Discharge status: Home / Self Care | Payer: Medicare Other | Source: Ambulatory Visit | Attending: Surgery | Admitting: Surgery

## 2023-05-03 DIAGNOSIS — Z170 Estrogen receptor positive status [ER+]: Secondary | ICD-10-CM

## 2023-05-03 DIAGNOSIS — C50122 Malignant neoplasm of central portion of left male breast: Secondary | ICD-10-CM | POA: Diagnosis not present

## 2023-05-03 DIAGNOSIS — Z01818 Encounter for other preprocedural examination: Secondary | ICD-10-CM | POA: Insufficient documentation

## 2023-05-03 DIAGNOSIS — I10 Essential (primary) hypertension: Secondary | ICD-10-CM | POA: Insufficient documentation

## 2023-05-03 DIAGNOSIS — Z01812 Encounter for preprocedural laboratory examination: Secondary | ICD-10-CM

## 2023-05-03 HISTORY — PX: BREAST BIOPSY: SHX20

## 2023-05-03 LAB — CBC
HCT: 50.6 % (ref 39.0–52.0)
Hemoglobin: 16.4 g/dL (ref 13.0–17.0)
MCH: 30.4 pg (ref 26.0–34.0)
MCHC: 32.4 g/dL (ref 30.0–36.0)
MCV: 93.7 fL (ref 80.0–100.0)
Platelets: 187 10*3/uL (ref 150–400)
RBC: 5.4 MIL/uL (ref 4.22–5.81)
RDW: 12.2 % (ref 11.5–15.5)
WBC: 8.5 10*3/uL (ref 4.0–10.5)
nRBC: 0 % (ref 0.0–0.2)

## 2023-05-03 LAB — BASIC METABOLIC PANEL
Anion gap: 13 (ref 5–15)
BUN: 16 mg/dL (ref 8–23)
CO2: 22 mmol/L (ref 22–32)
Calcium: 9.5 mg/dL (ref 8.9–10.3)
Chloride: 105 mmol/L (ref 98–111)
Creatinine, Ser: 1.12 mg/dL (ref 0.61–1.24)
GFR, Estimated: >60 mL/min (ref >60)
Glucose, Bld: 118 mg/dL — ABNORMAL HIGH (ref 70–99)
Potassium: 4 mmol/L (ref 3.5–5.1)
Sodium: 140 mmol/L (ref 135–145)

## 2023-05-03 MED ORDER — LIDOCAINE HCL (PF) 1 % IJ SOLN
5.0000 mL | Freq: Once | INTRAMUSCULAR | Status: AC
  Administered 2023-05-03: 5 mL
  Filled 2023-05-03: qty 5

## 2023-05-03 NOTE — Progress Notes (Signed)
  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 In review of his medication reconciliation it was noted that patient is on a prescribed GLP-1 medication. Per guidelines issued by the American Society of Anesthesiologists (ASA), it is recommended that these medications be held for 7 days prior to the patient undergoing any type of elective surgical procedure. The patient is taking the following GLP-1 medication:  []  SEMAGLUTIDE   []  EXENATIDE  []  LIRAGLUTIDE   []  LIXISENATIDE  [x]  DULAGLUTIDE     []  TIRZEPATIDE (GLP-1/GIP)  Reached out to prescribing provider Juan Piano, MD) to make them aware of the guidelines from anesthesia. Given that this patient takes the prescribed GLP-1 medication for his  diabetes diagnosis, rather than for weight loss, recommendations from the prescribing provider were solicited. Prescribing provider made aware of the following so that informed decision/POC can be developed for this patient that may be taking medications belonging to these drug classes:  Oral GLP-1 medications will be held 1 day prior to surgery.  Injectable GLP-1 medications will be held 7 days prior to surgery.  Metformin is routinely held 48 hours prior to surgery due to renal concerns, potential need for contrasted imaging  perioperatively, and the potential for tissue hypoxia leading to drug induced lactic acidosis.  All SGLT2i medications are held 72 hours prior to surgery as they can be associated with the increased potential for developing euglycemic diabetic ketoacidosis (EDKA).   Impression and Plan:  Juan Calhoun is on a prescribed GLP-1 medication, which induces the known side effect of decreased gastric emptying. Efforts are bring made to mitigate the risk of perioperative hyperglycemic events, as elevated blood glucose levels have been found to contribute to intra/postoperative complications. Additionally, hyperglycemic extremes can potentially necessitate the postponing of a patient's elective case in order to better optimize perioperative glycemic control, again with the aforementioned guidelines in place. With this in mind, recommendations have been sought from the prescribing provider, who has cleared patient to proceed with holding the prescribed GLP-1 as per the guidelines from the ASA.   Provider recommending: no further recommendations received from the prescribing provider.  Copy of signed clearance and recommendations placed on patient's chart for inclusion in their medical record and for review by the surgical/anesthetic team on the day of his procedure.   Juan Mulling, MSN, APRN, FNP-C, CEN [facility]  Peri-operative Services Nurse Practitioner Phone: [phone] 05/03/23 4:46 PM  NOTE: This note has been prepared using Dragon dictation software. Despite my best ability to proofread, there is always the potential that unintentional transcriptional errors may still occur from this process.

## 2023-05-04 ENCOUNTER — Other Ambulatory Visit: Payer: Medicare Other

## 2023-05-05 ENCOUNTER — Encounter: Payer: Self-pay | Admitting: Surgery

## 2023-05-05 ENCOUNTER — Ambulatory Visit: Payer: Medicare Other | Admitting: Urgent Care

## 2023-05-05 ENCOUNTER — Other Ambulatory Visit: Payer: Self-pay

## 2023-05-05 ENCOUNTER — Encounter
Admission: RE | Disposition: A | Discharge status: Home / Self Care | Payer: Self-pay | Source: Home / Self Care | Attending: Surgery

## 2023-05-05 ENCOUNTER — Ambulatory Visit
Admission: RE | Admit: 2023-05-05 | Discharge: 2023-05-05 | Disposition: A | Discharge status: Home / Self Care | Payer: Medicare Other | Source: Ambulatory Visit | Attending: Surgery | Admitting: Surgery

## 2023-05-05 ENCOUNTER — Ambulatory Visit: Payer: Self-pay | Admitting: Surgery

## 2023-05-05 ENCOUNTER — Observation Stay
Admission: RE | Admit: 2023-05-05 | Discharge: 2023-05-06 | Disposition: A | Discharge status: Home / Self Care | Payer: Medicare Other | Attending: Surgery | Admitting: Surgery

## 2023-05-05 DIAGNOSIS — Z170 Estrogen receptor positive status [ER+]: Secondary | ICD-10-CM | POA: Diagnosis not present

## 2023-05-05 DIAGNOSIS — Z01812 Encounter for preprocedural laboratory examination: Secondary | ICD-10-CM

## 2023-05-05 DIAGNOSIS — N183 Chronic kidney disease, stage 3 unspecified: Secondary | ICD-10-CM | POA: Diagnosis not present

## 2023-05-05 DIAGNOSIS — C50122 Malignant neoplasm of central portion of left male breast: Principal | ICD-10-CM | POA: Insufficient documentation

## 2023-05-05 DIAGNOSIS — E1122 Type 2 diabetes mellitus with diabetic chronic kidney disease: Secondary | ICD-10-CM | POA: Insufficient documentation

## 2023-05-05 DIAGNOSIS — Z87891 Personal history of nicotine dependence: Secondary | ICD-10-CM | POA: Diagnosis not present

## 2023-05-05 DIAGNOSIS — I129 Hypertensive chronic kidney disease with stage 1 through stage 4 chronic kidney disease, or unspecified chronic kidney disease: Secondary | ICD-10-CM | POA: Diagnosis not present

## 2023-05-05 DIAGNOSIS — Z171 Estrogen receptor negative status [ER-]: Secondary | ICD-10-CM

## 2023-05-05 DIAGNOSIS — Z79899 Other long term (current) drug therapy: Secondary | ICD-10-CM | POA: Diagnosis not present

## 2023-05-05 DIAGNOSIS — C50919 Malignant neoplasm of unspecified site of unspecified female breast: Secondary | ICD-10-CM | POA: Diagnosis present

## 2023-05-05 HISTORY — PX: AXILLARY LYMPH NODE DISSECTION: SHX5229

## 2023-05-05 HISTORY — PX: TOTAL MASTECTOMY: SHX6129

## 2023-05-05 LAB — GLUCOSE, CAPILLARY
Glucose-Capillary: 118 mg/dL — ABNORMAL HIGH (ref 70–99)
Glucose-Capillary: 147 mg/dL — ABNORMAL HIGH (ref 70–99)

## 2023-05-05 SURGERY — MASTECTOMY, SIMPLE
Anesthesia: General | Site: Breast | Laterality: Left

## 2023-05-05 MED ORDER — FENTANYL CITRATE (PF) 100 MCG/2ML IJ SOLN: 25.0000 ug | INTRAMUSCULAR | Status: DC | PRN

## 2023-05-05 MED ORDER — ACETAMINOPHEN 500 MG PO TABS: 500.0000 mg | ORAL_TABLET | Freq: Four times a day (QID) | ORAL | Status: DC | PRN

## 2023-05-05 MED ORDER — LACTATED RINGERS IV SOLN: INTRAVENOUS | Status: DC

## 2023-05-05 MED ORDER — MIDAZOLAM HCL 2 MG/2ML IJ SOLN
INTRAMUSCULAR | Status: AC
  Filled 2023-05-05: qty 2

## 2023-05-05 MED ORDER — ORAL CARE MOUTH RINSE: 15.0000 mL | Freq: Once | OROMUCOSAL | Status: AC

## 2023-05-05 MED ORDER — ONDANSETRON HCL 4 MG/2ML IJ SOLN
INTRAMUSCULAR | Status: DC | PRN
  Administered 2023-05-05: 4 mg via INTRAVENOUS

## 2023-05-05 MED ORDER — OXYCODONE HCL 5 MG PO TABS: 5.0000 mg | ORAL_TABLET | ORAL | Status: DC | PRN

## 2023-05-05 MED ORDER — BUPIVACAINE HCL (PF) 0.5 % IJ SOLN
INTRAMUSCULAR | Status: AC
  Filled 2023-05-05: qty 30

## 2023-05-05 MED ORDER — CELECOXIB 200 MG PO CAPS
200.0000 mg | ORAL_CAPSULE | Freq: Two times a day (BID) | ORAL | Status: DC
  Administered 2023-05-05 – 2023-05-06 (×2): 200 mg via ORAL

## 2023-05-05 MED ORDER — ACETAMINOPHEN 500 MG PO TABS
1000.0000 mg | ORAL_TABLET | ORAL | Status: AC
  Administered 2023-05-05: 1000 mg via ORAL

## 2023-05-05 MED ORDER — CEFAZOLIN SODIUM-DEXTROSE 2-4 GM/100ML-% IV SOLN
2.0000 g | INTRAVENOUS | Status: AC
  Administered 2023-05-05: 2 g via INTRAVENOUS

## 2023-05-05 MED ORDER — SODIUM CHLORIDE 0.9 % IV SOLN: INTRAVENOUS | Status: DC

## 2023-05-05 MED ORDER — TRAMADOL HCL 50 MG PO TABS: 50.0000 mg | ORAL_TABLET | Freq: Four times a day (QID) | ORAL | Status: DC | PRN

## 2023-05-05 MED ORDER — SUGAMMADEX SODIUM 200 MG/2ML IV SOLN
INTRAVENOUS | Status: DC | PRN
  Administered 2023-05-05: 200 mg via INTRAVENOUS

## 2023-05-05 MED ORDER — BUPIVACAINE LIPOSOME 1.3 % IJ SUSP
INTRAMUSCULAR | Status: DC | PRN
  Administered 2023-05-05: 20 mL

## 2023-05-05 MED ORDER — EPINEPHRINE PF 1 MG/ML IJ SOLN
INTRAMUSCULAR | Status: AC
  Filled 2023-05-05: qty 1

## 2023-05-05 MED ORDER — CELECOXIB 200 MG PO CAPS
ORAL_CAPSULE | ORAL | Status: AC
  Filled 2023-05-05: qty 1

## 2023-05-05 MED ORDER — FENTANYL CITRATE (PF) 100 MCG/2ML IJ SOLN
INTRAMUSCULAR | Status: DC | PRN
  Administered 2023-05-05 (×2): 50 ug via INTRAVENOUS

## 2023-05-05 MED ORDER — FAMOTIDINE 20 MG PO TABS
20.0000 mg | ORAL_TABLET | Freq: Once | ORAL | Status: AC
  Administered 2023-05-05: 20 mg via ORAL

## 2023-05-05 MED ORDER — HYDROMORPHONE HCL 1 MG/ML IJ SOLN
INTRAMUSCULAR | Status: AC
  Filled 2023-05-05: qty 1

## 2023-05-05 MED ORDER — PROPOFOL 10 MG/ML IV BOLUS
INTRAVENOUS | Status: AC
  Filled 2023-05-05: qty 20

## 2023-05-05 MED ORDER — GABAPENTIN 300 MG PO CAPS
300.0000 mg | ORAL_CAPSULE | Freq: Two times a day (BID) | ORAL | Status: DC
  Administered 2023-05-05 – 2023-05-06 (×2): 300 mg via ORAL

## 2023-05-05 MED ORDER — SODIUM CHLORIDE (PF) 0.9 % IJ SOLN
INTRAMUSCULAR | Status: AC
  Filled 2023-05-05: qty 50

## 2023-05-05 MED ORDER — LACTATED RINGERS IV SOLN: INTRAVENOUS | Status: DC | PRN

## 2023-05-05 MED ORDER — LOSARTAN POTASSIUM 50 MG PO TABS
ORAL_TABLET | ORAL | Status: AC
  Filled 2023-05-05: qty 1

## 2023-05-05 MED ORDER — MIDAZOLAM HCL 2 MG/2ML IJ SOLN
INTRAMUSCULAR | Status: DC | PRN
  Administered 2023-05-05: 1 mg via INTRAVENOUS

## 2023-05-05 MED ORDER — ONDANSETRON 4 MG PO TBDP: 4.0000 mg | ORAL_TABLET | Freq: Four times a day (QID) | ORAL | Status: DC | PRN

## 2023-05-05 MED ORDER — BUPIVACAINE LIPOSOME 1.3 % IJ SUSP
INTRAMUSCULAR | Status: AC
  Filled 2023-05-05: qty 20

## 2023-05-05 MED ORDER — CHLORHEXIDINE GLUCONATE 0.12 % MT SOLN
OROMUCOSAL | Status: AC
  Filled 2023-05-05: qty 15

## 2023-05-05 MED ORDER — LOSARTAN POTASSIUM 50 MG PO TABS
50.0000 mg | ORAL_TABLET | Freq: Every day | ORAL | Status: DC
  Administered 2023-05-05: 50 mg via ORAL

## 2023-05-05 MED ORDER — ONDANSETRON HCL 4 MG/2ML IJ SOLN: 4.0000 mg | Freq: Once | INTRAMUSCULAR | Status: DC | PRN

## 2023-05-05 MED ORDER — GABAPENTIN 300 MG PO CAPS
300.0000 mg | ORAL_CAPSULE | ORAL | Status: AC
  Administered 2023-05-05: 300 mg via ORAL

## 2023-05-05 MED ORDER — FENTANYL CITRATE (PF) 100 MCG/2ML IJ SOLN
INTRAMUSCULAR | Status: AC
  Filled 2023-05-05: qty 2

## 2023-05-05 MED ORDER — ACETAMINOPHEN 10 MG/ML IV SOLN: 1000.0000 mg | Freq: Once | INTRAVENOUS | Status: DC | PRN

## 2023-05-05 MED ORDER — CHLORHEXIDINE GLUCONATE CLOTH 2 % EX PADS
6.0000 | MEDICATED_PAD | Freq: Once | CUTANEOUS | Status: AC
  Administered 2023-05-05: 6 via TOPICAL

## 2023-05-05 MED ORDER — FAMOTIDINE 20 MG PO TABS
ORAL_TABLET | ORAL | Status: AC
  Filled 2023-05-05: qty 1

## 2023-05-05 MED ORDER — OXYCODONE HCL 5 MG PO TABS: 5.0000 mg | ORAL_TABLET | Freq: Once | ORAL | Status: DC | PRN

## 2023-05-05 MED ORDER — PROPOFOL 10 MG/ML IV BOLUS
INTRAVENOUS | Status: DC | PRN
  Administered 2023-05-05: 150 mg via INTRAVENOUS

## 2023-05-05 MED ORDER — HYDROMORPHONE HCL 1 MG/ML IJ SOLN: 0.5000 mg | INTRAMUSCULAR | Status: DC | PRN

## 2023-05-05 MED ORDER — ROCURONIUM BROMIDE 100 MG/10ML IV SOLN
INTRAVENOUS | Status: DC | PRN
  Administered 2023-05-05 (×2): 5 mg via INTRAVENOUS
  Administered 2023-05-05: 50 mg via INTRAVENOUS

## 2023-05-05 MED ORDER — DEXAMETHASONE SODIUM PHOSPHATE 10 MG/ML IJ SOLN
INTRAMUSCULAR | Status: DC | PRN
  Administered 2023-05-05: 5 mg via INTRAVENOUS

## 2023-05-05 MED ORDER — GABAPENTIN 300 MG PO CAPS
ORAL_CAPSULE | ORAL | Status: AC
  Filled 2023-05-05: qty 1

## 2023-05-05 MED ORDER — HYDROMORPHONE HCL 1 MG/ML IJ SOLN
INTRAMUSCULAR | Status: DC | PRN
  Administered 2023-05-05 (×2): .2 mg via INTRAVENOUS
  Administered 2023-05-05: .3 mg via INTRAVENOUS

## 2023-05-05 MED ORDER — ACETAMINOPHEN 500 MG PO TABS
ORAL_TABLET | ORAL | Status: AC
  Filled 2023-05-05: qty 2

## 2023-05-05 MED ORDER — CHLORHEXIDINE GLUCONATE 0.12 % MT SOLN
15.0000 mL | Freq: Once | OROMUCOSAL | Status: AC
  Administered 2023-05-05: 15 mL via OROMUCOSAL

## 2023-05-05 MED ORDER — BUPIVACAINE-EPINEPHRINE (PF) 0.5% -1:200000 IJ SOLN
INTRAMUSCULAR | Status: DC | PRN
  Administered 2023-05-05: 20 mL
  Administered 2023-05-05: 10 mL

## 2023-05-05 MED ORDER — STERILE WATER FOR IRRIGATION IR SOLN
Status: DC | PRN
  Administered 2023-05-05: 500 mL

## 2023-05-05 MED ORDER — OXYCODONE HCL 5 MG/5ML PO SOLN: 5.0000 mg | Freq: Once | ORAL | Status: DC | PRN

## 2023-05-05 MED ORDER — CELECOXIB 200 MG PO CAPS
200.0000 mg | ORAL_CAPSULE | ORAL | Status: AC
  Administered 2023-05-05: 200 mg via ORAL

## 2023-05-05 MED ORDER — ONDANSETRON HCL 4 MG/2ML IJ SOLN: 4.0000 mg | Freq: Four times a day (QID) | INTRAMUSCULAR | Status: DC | PRN

## 2023-05-05 MED ORDER — CEFAZOLIN SODIUM-DEXTROSE 2-4 GM/100ML-% IV SOLN
INTRAVENOUS | Status: AC
  Filled 2023-05-05: qty 100

## 2023-05-05 MED ORDER — PHENYLEPHRINE HCL-NACL 20-0.9 MG/250ML-% IV SOLN
INTRAVENOUS | Status: AC
  Filled 2023-05-05: qty 250

## 2023-05-05 SURGICAL SUPPLY — 62 items
ADH SKN CLS APL DERMABOND .7 (GAUZE/BANDAGES/DRESSINGS) ×1
APL PRP STRL LF DISP 70% ISPRP (MISCELLANEOUS) ×1
APPLIER CLIP 11 MED OPEN (CLIP)
APR CLP MED 11 20 MLT OPN (CLIP)
BINDER BREAST LRG (GAUZE/BANDAGES/DRESSINGS) IMPLANT
BINDER BREAST XLRG (GAUZE/BANDAGES/DRESSINGS) IMPLANT
BLADE PHOTON ILLUMINATED (MISCELLANEOUS) ×1 IMPLANT
BLADE SURG 15 STRL LF DISP TIS (BLADE) ×1 IMPLANT
BLADE SURG 15 STRL SS (BLADE) ×1
BULB RESERV EVAC DRAIN JP 100C (MISCELLANEOUS) IMPLANT
CHLORAPREP W/TINT 26 (MISCELLANEOUS) ×1 IMPLANT
CLIP APPLIE 11 MED OPEN (CLIP) IMPLANT
CNTNR URN SCR LID CUP LEK RST (MISCELLANEOUS) IMPLANT
CONT SPEC 4OZ STRL OR WHT (MISCELLANEOUS)
COVER PROBE GAMMA FINDER SLV (MISCELLANEOUS) ×1 IMPLANT
DERMABOND ADVANCED .7 DNX12 (GAUZE/BANDAGES/DRESSINGS) ×1 IMPLANT
DRAIN CHANNEL JP 15F RND 16 (MISCELLANEOUS) IMPLANT
DRAIN JP 15F RND TROCAR (DRAIN) IMPLANT
DRAPE LAPAROTOMY TRNSV 106X77 (MISCELLANEOUS) ×1 IMPLANT
DRSG GAUZE FLUFF 36X18 (GAUZE/BANDAGES/DRESSINGS) ×1 IMPLANT
DRSG OPSITE POSTOP 4X10 (GAUZE/BANDAGES/DRESSINGS) IMPLANT
DRSG OPSITE POSTOP 4X6 (GAUZE/BANDAGES/DRESSINGS) IMPLANT
ELECT REM PT RETURN 9FT ADLT (ELECTROSURGICAL) ×1
ELECTRODE REM PT RTRN 9FT ADLT (ELECTROSURGICAL) ×1 IMPLANT
GAUZE 4X4 16PLY ~~LOC~~+RFID DBL (SPONGE) ×1 IMPLANT
GAUZE SPONGE 4X4 12PLY STRL (GAUZE/BANDAGES/DRESSINGS) IMPLANT
GLOVE BIOGEL PI IND STRL 7.0 (GLOVE) ×1 IMPLANT
GLOVE SURG SYN 6.5 ES PF (GLOVE) ×2 IMPLANT
GLOVE SURG SYN 6.5 PF PI (GLOVE) ×1 IMPLANT
GOWN STRL REUS W/ TWL LRG LVL3 (GOWN DISPOSABLE) ×2 IMPLANT
GOWN STRL REUS W/TWL LRG LVL3 (GOWN DISPOSABLE) ×2
KIT MARKER MARGIN INK (KITS) IMPLANT
KIT TURNOVER KIT A (KITS) ×1 IMPLANT
LABEL OR SOLS (LABEL) ×1 IMPLANT
LIGHT WAVEGUIDE WIDE FLAT (MISCELLANEOUS) IMPLANT
MANIFOLD NEPTUNE II (INSTRUMENTS) ×1 IMPLANT
NDL HYPO 22X1.5 SAFETY MO (MISCELLANEOUS) ×2 IMPLANT
NEEDLE HYPO 22X1.5 SAFETY MO (MISCELLANEOUS) ×2 IMPLANT
PACK BASIN MINOR ARMC (MISCELLANEOUS) ×1 IMPLANT
SET LOCALIZER 20 PROBE US (MISCELLANEOUS) IMPLANT
SPONGE T-LAP 18X18 ~~LOC~~+RFID (SPONGE) ×1 IMPLANT
STAPLER SKIN PROX 35W (STAPLE) IMPLANT
SUT ETHILON 3-0 FS-10 30 BLK (SUTURE) ×1
SUT MNCRL 4-0 (SUTURE) ×1
SUT MNCRL 4-0 27XMFL (SUTURE) ×1
SUT SILK 2 0 (SUTURE) ×1
SUT SILK 2 0 SH (SUTURE) ×1 IMPLANT
SUT SILK 2-0 30XBRD TIE 12 (SUTURE) ×1 IMPLANT
SUT SILK 3 0 12 30 (SUTURE) ×1 IMPLANT
SUT VIC AB 2-0 SH 27 (SUTURE) ×1
SUT VIC AB 2-0 SH 27XBRD (SUTURE) ×1 IMPLANT
SUT VIC AB 3-0 SH 27 (SUTURE) ×4
SUT VIC AB 3-0 SH 27X BRD (SUTURE) ×4 IMPLANT
SUTURE EHLN 3-0 FS-10 30 BLK (SUTURE) IMPLANT
SUTURE MNCRL 4-0 27XMF (SUTURE) ×1 IMPLANT
SYR 10ML LL (SYRINGE) ×1 IMPLANT
SYR 20ML LL LF (SYRINGE) ×2 IMPLANT
SYR BULB IRRIG 60ML STRL (SYRINGE) ×1 IMPLANT
TRAP FLUID SMOKE EVACUATOR (MISCELLANEOUS) ×1 IMPLANT
TUBING CONNECTING 10 (TUBING) ×1 IMPLANT
WATER STERILE IRR 1000ML POUR (IV SOLUTION) ×1 IMPLANT
WATER STERILE IRR 500ML POUR (IV SOLUTION) ×1 IMPLANT

## 2023-05-05 NOTE — H&P (Signed)
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 is a 82 y.o. male who was referred by Joesphine Bare* for evaluation of above. Change was noted few weeks ago, when he developed nipple pain, left side. Since then, he has noted some right sided nipple pain as well. Hx of chronic low back pain for years, no recent worsening. Otherwise denies any other complaints.  Past Medical History: has a past medical history of Cataract cortical, senile, Chronic back pain, CKD (chronic kidney disease) stage 3, GFR 30-59 ml/min (CMS/HHS-HCC), Diabetes mellitus type 2, uncomplicated (CMS/HHS-HCC), External hemorrhoids (09/19/2015), Hypercholesterolemia, Hyperlipidemia, Hyperplastic colon polyp (09/19/2015), Hypertension, Internal hemorrhoids (09/19/2015), Obesity, Primary osteoarthritis involving multiple joints (05/05/2018), Tubular adenoma of colon (09/19/2015), and Type 2 diabetes mellitus with stage 3 chronic kidney disease (CMS/HHS-HCC).  Past Surgical History: has a past surgical history that includes Cholecystectomy; Appendectomy; Colonoscopy (09/19/2015); Cataract extraction; and Colonoscopy (01/27/2021).  Family History: family history includes Alzheimer's disease in his maternal aunt and maternal aunt; Breast cancer in his mother; Coronary Artery Disease (Blocked arteries around heart) in his brother and maternal uncle; Myocardial Infarction (Heart attack) in his father.  Social History: reports that he quit smoking about 36 years ago. His smoking use included cigarettes. He started smoking about 58 years ago. He has a 44 pack-year smoking history. He has never used smokeless tobacco. He reports that he does not drink alcohol and does not use drugs.  Current Medications: has a current medication list which includes the following prescription(s): atorvastatin, cyanocobalamin, dulaglutide,  ketoconazole, losartan, and triamcinolone.  Allergies:  Allergies as of 04/11/2023  (No Known Allergies)   ROS:  A 15 point review of systems was performed and was negative except as noted in HPI  Objective:    BP 119/65  Pulse 88  Ht 182.5 cm (5' 11.85")  Wt 93.3 kg (205 lb 11 oz)  BMI 28.01 kg/m   Constitutional : No distress, cooperative, alert  Lymphatics/Throat: Supple with no lymphadenopathy  Respiratory: Clear to auscultation bilaterally  Cardiovascular: Regular rate and rhythm  Gastrointestinal: Soft, non-tender, non-distended, no organomegaly, reducible umblical hernia, no TTP  Musculoskeletal: Steady gait and movement  Skin: Cool and moist, RUQ surgical scar  Psychiatric: Normal affect, non-agitated, not confused  Breast: Normal appearance bilateral breast. Right side with pea size deep nodule palpated around 11 oclock position, 6-7cm from nipple area, adjacent to visible hemangioma on chest wall. Left side with bruising from recent biopsy, palpable subareolar mass as well as separate distinct dime sized nodule on medial aspect, near mammary border. Golfball size firm mass noted within axilla, presumed areas of lymphadenopathy noted on [place]. Chaperone present for exam.    LABS:  eason for Addendum #1: Breast Biomarker Results   Specimen Submitted:  A. Breast, left, 6:00 (coil)  B. Lymph node, left axilla (hydromark)   Clinical History: Remote history of left shoulder melanoma, presenting  with breast mass and lymphadenopathy. Breast mass: nodular gynecomastia  vs breast malignancy vs metastatic melanoma lymph node: metastatic  disease vs lymphoproliferative disease   DIAGNOSIS:  A. BREAST, LEFT 6:00; ULTRASOUND-GUIDED BIOPSY:  - INVASIVE MAMMARY CARCINOMA, NO SPECIAL TYPE, WITH NECROSIS.  Size of invasive carcinoma: 13 mm in this sample  Histologic grade of invasive carcinoma: Grade 2  Glandular/tubular differentiation score: 3  Nuclear pleomorphism score: 2   Mitotic rate score: 1  Total score: 6  Ductal carcinoma in situ: Not  identified   B. LYMPH NODE, LEFT AXILLA; ULTRASOUND-GUIDED BIOPSY:  - LYMPHOID TISSUE WITH METASTATIC MAMMARY CARCINOMA WITH FOCAL SOLID  PAPILLARY MORPHOLOGY.  - SEE COMMENT.   Comment:  While the cytologic features of the metastatic carcinoma in part B are  similar to the carcinoma sampled in part A definite solid papillary  morphology is not appreciated in part A. This likely represents sampling  bias.  The definitive grade will be assigned on the excisional specimen.  ER/PR/HER2: Immunohistochemistry will be performed on block A1, with  reflex to FISH for HER2 2+. The results will be reported in an addendum.   GROSS DESCRIPTION:  A. Labeled: Left breast 6:00 retroareolar  Received: Formalin  Time/date in fixative: Collected and placed in formalin at 8:30 AM on  04/01/2023  Cold ischemic time: Less than 1 minute  Total fixation time: Approximately 8.75 hours  Core pieces: 4 cores and 1 additional fragment  Size: Range from 1.4-1.8 cm in length and 0.2 cm in diameter  Description: Received are cores and fragments of white-yellow fibrofatty  tissue. The additional fragment is 0.2 x 0.1 x 0.1 cm.  Ink color: Blue  Entirely submitted in cassettes 1-2 with 2 cores in cassette 1 and 2  cores with the remaining fragment in cassette 2.   B. Labeled: Left axillary lymph node  Received: In 2 containers designated as B1 and B2. B1 is received in  formalin and B2 is received fresh on saline soaked gauze.  Time/date in fixative: B1 is collected and placed in formalin at 8:45 AM  on 04/01/2023. B2 is collected at 8:30 AM on 04/01/2023 and placed in  formalin at 10:01 AM on 04/01/2023.  Cold ischemic time: B1: Less than 1 minute; B2: Over 1 hour  Total fixation time: B1: Approximately 8.5 hours; B2: Approximate 7.25  hours  Core pieces: 6 cores and multiple additional fragments  Size: Range from 0.5-1.7 cm in length and 0.2  cm in diameter  Description: Received are cores and fragments of pink-yellow, friable,  soft tissue. The additional fragments are 2 x 0.5 x 0.2 cm in  aggregate.  Ink color: Green  Entirely submitted in cassettes 1-3 with 3 cores in cassette 1, 1 core  with fragments in cassette 2, and 2 cores with the remaining fragments  in cassette 3.   RB 04/01/2023   Final Diagnosis performed by Elijah Birk, MD. Electronically signed  04/04/2023 11:39:25AM  The electronic signature indicates that the named Attending Pathologist  has evaluated the specimen  Technical component performed at [facility], [address] [place],  [place] [address] Lab: [phone] Dir: Jolene Schimke, MD, MMM  Professional component performed at [facility], [facility], [address] [place], [place], [place] [address] Lab: [phone]  Dir: Beryle Quant, MD   ADDENDUM:  CASE SUMMARY: BREAST BIOMARKER TESTS  Estrogen Receptor (ER) Status: POSITIVE  Percentage of cells with nuclear positivity: Greater than 90%  Average intensity of staining: Strong   Progesterone Receptor (PgR) Status: POSITIVE  Percentage of cells with nuclear positivity: Greater than 90%  Average intensity of staining: Strong   HER2 (by immunohistochemistry): NEGATIVE (Score 0)   Ki-67: Not performed   Cold Ischemia and Fixation Times: Meet requirements specified in latest  version of the ASCO/CAP guidelines  Testing Performed on Block Number(s): A1   METHODS  Fixative: Formalin  Estrogen Receptor: FDA cleared ([place]) Primary Antibody: SP1  Progesterone Receptor: FDA cleared ([place]) Primary Antibody: 1E2  HER2 (by IHC): FDA approved (  [place]) Primary Antibody: 4B5 ([facility])  Immunohistochemistry controls worked appropriately. Slides were prepared  by Sammuel Hines, [place], and interpreted by Elijah Birk, MD.  (v1.5.0.1)   Addendum #1 performed by Elijah Birk, MD. Electronically signed  04/05/2023 11:07:53AM  The  electronic signature indicates that the named Attending Pathologist  has evaluated the specimen  Technical component performed at [facility], [address], [place],  [place] [address] Lab: [phone] Dir: Jolene Schimke, MD, MMM  Professional component performed at [facility], [facility], [address] [place], [place], [place] [address] Lab: [phone]  Dir: Beryle Quant, MD   RADS: CLINICAL DATA: 82 year old male with left breast mass and left axillary lymphadenopathy  EXAM: ULTRASOUND GUIDED LEFT BREAST CORE NEEDLE BIOPSY; ULTRASOUND-GUIDED LEFT AXILLARY CORE NEEDLE BIOPSY  COMPARISON: Previous exam(s).  PROCEDURE: I met with the patient and we discussed the procedure of ultrasound-guided biopsy, including benefits and alternatives. We discussed the high likelihood of a successful procedure. We discussed the risks of the procedure, including infection, bleeding, tissue injury, clip migration, and inadequate sampling. Informed written consent was given. The usual time-out protocol was performed immediately prior to the procedure.  SITE 1: Left breast mass 6 o'clock retroareolar (coil clip)  Lesion quadrant: Lower outer quadrant  Using sterile technique and 1% Lidocaine as local anesthetic, under direct ultrasound visualization, a 14 gauge spring-loaded device was used to perform biopsy of a mass in the left breast 6 o'clock position using a lateral approach. At the conclusion of the procedure, a coil shaped tissue marker clip was deployed into the biopsy cavity.  SITE 2: Left axillary lymph node (HydroMARK clip)  Lesion quadrant: Upper outer quadrant  Using sterile technique and 1% Lidocaine as local anesthetic, under direct ultrasound visualization, a 14 gauge spring-loaded device was used to perform biopsy of an enlarged left axillary lymph node using an inferior approach. At the conclusion of the procedure, a HydroMARK tissue marker clip was deployed  into the biopsy cavity.  Follow up 2 view mammogram was performed and dictated separately.  IMPRESSION: Ultrasound guided biopsy of a left breast mass and a left axillary lymph node. No apparent complications.  Electronically Signed: By: Jacob Moores M.D. On: 04/01/2023 09:56 Addendum  Addendum by Jacob Moores, MD on 04/04/2023 4:07 PM EDT ADDENDUM REPORT: 04/04/2023 14:07  ADDENDUM: PATHOLOGY revealed: Site A. BREAST, LEFT 6:00; ULTRASOUND-GUIDED BIOPSY: - INVASIVE MAMMARY CARCINOMA, NO SPECIAL TYPE, WITH NECROSIS. 13 mm in this sample. Grade 2. Ductal carcinoma in situ: Not identified.  Pathology results are CONCORDANT with imaging findings, per Dr. Jacob Moores.  PATHOLOGY revealed: Site B. 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 Comment: While the cytologic features of the metastatic carcinoma in part B are similar to the carcinoma sampled in part A definite solid papillary morphology is not appreciated in part A. This likely represents sampling bias.  Pathology results are CONCORDANT with imaging findings, per Dr. Jacob Moores.  Pathology results and recommendations below were discussed with patient by telephone on 04/04/2023. Patient reported biopsy site within normal limits with slight tenderness at the site. Post biopsy care instructions were reviewed, questions were answered and my direct phone number was provided to patient. Patient was instructed to call [facility] if any concerns or questions arise related to the biopsy.  RECOMMENDATIONS: 1. Surgical and oncological consultation. Request for surgical and oncological consultation relayed to Irving Shows RN at [facility] by Randa Lynn RN on  04/04/2023.  Pathology results reported by Randa Lynn RN on 04/04/2023.  Electronically Signed By: Jacob Moores M.D. On:  04/04/2023 14:07  CLINICAL DATA: LEFT breast pain.   EXAM:  DIGITAL DIAGNOSTIC BILATERAL MAMMOGRAM WITH TOMOSYNTHESIS;  ULTRASOUND LEFT BREAST LIMITED   TECHNIQUE:  Bilateral digital diagnostic mammography and breast tomosynthesis  was performed.; Targeted ultrasound examination of the left breast  was performed.   COMPARISON: Previous exam(s).   ACR Breast Density Category b: There are scattered areas of  fibroglandular density.   FINDINGS:  Diagnostic images were obtained of the LEFT breast. There is a flame  shaped focal asymmetry in the LEFT retroareolar breast. However in  addition, there is a more focal masslike density along the inferior  aspect of the retroareolar breast. It is best seen on spot CC slice  40, ML slice 28, MLO slice 46.   No suspicious mass, distortion, or microcalcifications are  identified to suggest presence of malignancy in the RIGHT breast.   On physical exam, there is asymmetric firmness of the inferior LEFT  breast.   Targeted ultrasound was performed of the LEFT retroareolar breast.  There is a focal hypoechoic masslike area with irregular borders in  the 6 o'clock retroareolar breast. It measures approximately 12 by  11 x 10 mm. This corresponds to the masslike area noted  mammographically. More planar appearing gynecomastia is noted in the  retroareolar breast.   Physical exam was performed of the LEFT axilla. There is a mobile  mass appreciated in the LEFT axilla. Ultrasound was subsequently  performed. There is a markedly enlarged axillary lymph node with  internal cystic foci most consistent with necrosis. There is a  second enlarged axillary lymph node immediately adjacent. Cortical  thickness is at least 16 mm. No discrete echogenic hila are noted.  Several benign appearing LEFT axillary lymph nodes are noted  superiorly to these 2 adjacent lymph nodes.   IMPRESSION:  1. There are 2 suspicious LEFT axillary lymph nodes. Patient  reports  a history of remote LEFT shoulder melanoma. Recommend  ultrasound-guided biopsy for definitive characterization to evaluate  for metastatic disease versus lymphoproliferative disorder.  2. There is a 12 mm mass in the LEFT breast at 6 o'clock which is  indeterminate. While this may reflect nodular gynecomastia,  malignancy is not excluded. Recommend LEFT breast ultrasound-guided  biopsy.  3. No mammographic evidence of malignancy in the RIGHT breast.   RECOMMENDATION:  LEFT breast ultrasound-guided biopsy x2   I have discussed the findings and recommendations with the patient.  The biopsy procedure was discussed with the patient and questions  were answered. Patient expressed their understanding of the biopsy  recommendation. Patient will be scheduled for biopsy at her earliest  convenience by the schedulers. Ordering provider will be notified.  If applicable, a reminder letter will be sent to the patient  regarding the next appointment.   BI-RADS CATEGORY 5: Highly suggestive of malignancy.   Electronically Signed  By: Meda Klinefelter M.D.  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 We specifically discussed how extent of dissection likely will be similar to a full axillary dissection due to size of lymph node already involved.  Plan:    1. Malignant neoplasm  of central portion of left breast in male, estrogen receptor positive (CMS/HHS-HCC) [U13.244, Z17.0] Discussed the risk of surgery including recurrence, chronic pain, post-op infxn, poor/delayed wound healing, poor cosmesis, seroma, hematoma formation, and possible re-operation to address said risks. The risks of  general anesthetic, if used, includes MI, CVA, sudden death or even reaction to anesthetic medications also discussed.  Typical post-op recovery time and possbility of activity restrictions were also discussed. Alternatives include continued observation. Benefits include possible symptom relief, pathologic evaluation, and/or curative excision.   The patient verbalized understanding and all questions were answered to the patient's satisfaction.  2. Patient has elected to proceed with surgical treatment. Procedure will be scheduled. Left mastectomy, axillary lymph node dissection, AFTER [place] assessment of right breast mass palpated on exam today. Case discussed with Dr. Kerby Nora and she stated US examination appropriate for a palpable mass.  Once [place] clears of any pathology on right, will proceed with scheduling surgery for known left breast Cancer.  He is also pending genetics counseling. No further workup needed in the meantime after discussion of oncology   labs/images/medications/previous chart entries reviewed personally and relevant changes/updates noted above.

## 2023-05-05 NOTE — Interval H&P Note (Signed)
History and Physical Interval Note:  05/05/2023 1:56 PM  Juan Calhoun  has presented today for surgery, with the diagnosis of malignant neoplasm of central portion of left breast in male, estrogen receptor positive C50.122, Z17.0.  The various methods of treatment have been discussed with the patient and family. After consideration of risks, benefits and other options for treatment, the patient has consented to  Procedure(s): TOTAL MASTECTOMY w/ RF tag breast and axillary (Left) AXILLARY LYMPH NODE DISSECTION (Left) as a surgical intervention.  The patient's history has been reviewed, patient examined, no change in status, stable for surgery.  I have reviewed the patient's chart and labs.  Questions were answered to the patient's satisfaction.     Savion Washam Tonna Boehringer

## 2023-05-05 NOTE — H&P (Signed)
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 is a 82 y.o. male who was referred by Marshall Wilson Anderso* for evaluation of above. Change was noted few weeks ago, when he developed nipple pain, left side. Since then, he has noted some right sided nipple pain as well. Hx of chronic low back pain for years, no recent worsening. Otherwise denies any other complaints.  Past Medical History: has a past medical history of Cataract cortical, senile, Chronic back pain, CKD (chronic kidney disease) stage 3, GFR 30-59 ml/min (CMS/HHS-HCC), Diabetes mellitus type 2, uncomplicated (CMS/HHS-HCC), External hemorrhoids (09/19/2015), Hypercholesterolemia, Hyperlipidemia, Hyperplastic colon polyp (09/19/2015), Hypertension, Internal hemorrhoids (09/19/2015), Obesity, Primary osteoarthritis involving multiple joints (05/05/2018), Tubular adenoma of colon (09/19/2015), and Type 2 diabetes mellitus with stage 3 chronic kidney disease (CMS/HHS-HCC).  Past Surgical History: has a past surgical history that includes Cholecystectomy; Appendectomy; Colonoscopy (09/19/2015); Cataract extraction; and Colonoscopy (01/27/2021).  Family History: family history includes Alzheimer's disease in his maternal aunt and maternal aunt; Breast cancer in his mother; Coronary Artery Disease (Blocked arteries around heart) in his brother and maternal uncle; Myocardial Infarction (Heart attack) in his father.  Social History: reports that he quit smoking about 36 years ago. His smoking use included cigarettes. He started smoking about 58 years ago. He has a 44 pack-year smoking history. He has never used smokeless tobacco. He reports that he does not drink alcohol and does not use drugs.  Current Medications: has a current medication list which includes the following prescription(s): atorvastatin, cyanocobalamin, dulaglutide,  ketoconazole, losartan, and triamcinolone.  Allergies:  Allergies as of 04/11/2023  (No Known Allergies)   ROS:  A 15 point review of systems was performed and was negative except as noted in HPI  Objective:    BP 119/65  Pulse 88  Ht 182.5 cm (5' 11.85")  Wt 93.3 kg (205 lb 11 oz)  BMI 28.01 kg/m   Constitutional : No distress, cooperative, alert  Lymphatics/Throat: Supple with no lymphadenopathy  Respiratory: Clear to auscultation bilaterally  Cardiovascular: Regular rate and rhythm  Gastrointestinal: Soft, non-tender, non-distended, no organomegaly, reducible umblical hernia, no TTP  Musculoskeletal: Steady gait and movement  Skin: Cool and moist, RUQ surgical scar  Psychiatric: Normal affect, non-agitated, not confused  Breast: Normal appearance bilateral breast. Right side with pea size deep nodule palpated around 11 oclock position, 6-7cm from nipple area, adjacent to visible hemangioma on chest wall. Left side with bruising from recent biopsy, palpable subareolar mass as well as separate distinct dime sized nodule on medial aspect, near mammary border. Golfball size firm mass noted within axilla, presumed areas of lymphadenopathy noted on US. Chaperone present for exam.    LABS:  eason for Addendum #1: Breast Biomarker Results   Specimen Submitted:  A. Breast, left, 6:00 (coil)  B. Lymph node, left axilla (hydromark)   Clinical History: Remote history of left shoulder melanoma, presenting  with breast mass and lymphadenopathy. Breast mass: nodular gynecomastia  vs breast malignancy vs metastatic melanoma lymph node: metastatic  disease vs lymphoproliferative disease   DIAGNOSIS:  A. BREAST, LEFT 6:00; ULTRASOUND-GUIDED BIOPSY:  - INVASIVE MAMMARY CARCINOMA, NO SPECIAL TYPE, WITH NECROSIS.  Size of invasive carcinoma: 13 mm in this sample  Histologic grade of invasive carcinoma: Grade 2  Glandular/tubular differentiation score: 3  Nuclear pleomorphism score: 2   Mitotic rate score: 1  Total score: 6  Ductal carcinoma in situ: Not   identified   B. LYMPH NODE, LEFT AXILLA; ULTRASOUND-GUIDED BIOPSY:  - LYMPHOID TISSUE WITH METASTATIC MAMMARY CARCINOMA WITH FOCAL SOLID  PAPILLARY MORPHOLOGY.  - SEE COMMENT.   Comment:  While the cytologic features of the metastatic carcinoma in part B are  similar to the carcinoma sampled in part A definite solid papillary  morphology is not appreciated in part A. This likely represents sampling  bias.  The definitive grade will be assigned on the excisional specimen.  ER/PR/HER2: Immunohistochemistry will be performed on block A1, with  reflex to FISH for HER2 2+. The results will be reported in an addendum.   GROSS DESCRIPTION:  A. Labeled: Left breast 6:00 retroareolar  Received: Formalin  Time/date in fixative: Collected and placed in formalin at 8:30 AM on  04/01/2023  Cold ischemic time: Less than 1 minute  Total fixation time: Approximately 8.75 hours  Core pieces: 4 cores and 1 additional fragment  Size: Range from 1.4-1.8 cm in length and 0.2 cm in diameter  Description: Received are cores and fragments of white-yellow fibrofatty  tissue. The additional fragment is 0.2 x 0.1 x 0.1 cm.  Ink color: Blue  Entirely submitted in cassettes 1-2 with 2 cores in cassette 1 and 2  cores with the remaining fragment in cassette 2.   B. Labeled: Left axillary lymph node  Received: In 2 containers designated as B1 and B2. B1 is received in  formalin and B2 is received fresh on saline soaked gauze.  Time/date in fixative: B1 is collected and placed in formalin at 8:45 AM  on 04/01/2023. B2 is collected at 8:30 AM on 04/01/2023 and placed in  formalin at 10:01 AM on 04/01/2023.  Cold ischemic time: B1: Less than 1 minute; B2: Over 1 hour  Total fixation time: B1: Approximately 8.5 hours; B2: Approximate 7.25  hours  Core pieces: 6 cores and multiple additional fragments  Size: Range from 0.5-1.7 cm in length and 0.2  cm in diameter  Description: Received are cores and fragments of pink-yellow, friable,  soft tissue. The additional fragments are 2 x 0.5 x 0.2 cm in  aggregate.  Ink color: Green  Entirely submitted in cassettes 1-3 with 3 cores in cassette 1, 1 core  with fragments in cassette 2, and 2 cores with the remaining fragments  in cassette 3.   RB 04/01/2023   Final Diagnosis performed by Tara Rubinas, MD. Electronically signed  04/04/2023 11:39:25AM  The electronic signature indicates that the named Attending Pathologist  has evaluated the specimen  Technical component performed at [facility], [address], [place],  [place] [address] Lab: [phone] Dir: Sanjai Nagendra, MD, MMM  Professional component performed at [facility], [facility], [address], [place],  [address] Lab: [phone]  Dir: Heath M. Jones, MD   ADDENDUM:  CASE SUMMARY: BREAST BIOMARKER TESTS  Estrogen Receptor (ER) Status: POSITIVE  Percentage of cells with nuclear positivity: Greater than 90%  Average intensity of staining: Strong   Progesterone Receptor (PgR) Status: POSITIVE  Percentage of cells with nuclear positivity: Greater than 90%  Average intensity of staining: Strong   HER2 (by immunohistochemistry): NEGATIVE (Score 0)   Ki-67: Not performed   Cold Ischemia and Fixation Times: Meet requirements specified in latest  version of the ASCO/CAP guidelines  Testing Performed on Block Number(s): A1   METHODS  Fixative: Formalin  Estrogen Receptor: FDA cleared ([place]) Primary Antibody: SP1  Progesterone Receptor: FDA cleared ([place]) Primary Antibody: 1E2  HER2 (by IHC): FDA approved (  [place]) Primary Antibody: 4B5 ([facility])  Immunohistochemistry controls worked appropriately. Slides were prepared  by [facility] [place], [place], and interpreted by Tara Rubinas, MD.  (v1.5.0.1)   Addendum #1 performed by Tara Rubinas, MD. Electronically signed  04/05/2023 11:07:53AM  The  electronic signature indicates that the named Attending Pathologist  has evaluated the specimen  Technical component performed at [facility], [address], [place],  [place] [address] Lab: [phone] Dir: Sanjai Nagendra, MD, MMM  Professional component performed at [facility], [facility], [address], [place], [place] [address] Lab: [phone]  Dir: Heath M. Jones, MD   RADS: CLINICAL DATA: 81-year-old male with left breast mass and left axillary lymphadenopathy  EXAM: ULTRASOUND GUIDED LEFT BREAST CORE NEEDLE BIOPSY; ULTRASOUND-GUIDED LEFT AXILLARY CORE NEEDLE BIOPSY  COMPARISON: Previous exam(s).  PROCEDURE: I met with the patient and we discussed the procedure of ultrasound-guided biopsy, including benefits and alternatives. We discussed the high likelihood of a successful procedure. We discussed the risks of the procedure, including infection, bleeding, tissue injury, clip migration, and inadequate sampling. Informed written consent was given. The usual time-out protocol was performed immediately prior to the procedure.  SITE 1: Left breast mass 6 o'clock retroareolar (coil clip)  Lesion quadrant: Lower outer quadrant  Using sterile technique and 1% Lidocaine as local anesthetic, under direct ultrasound visualization, a 14 gauge spring-loaded device was used to perform biopsy of a mass in the left breast 6 o'clock position using a lateral approach. At the conclusion of the procedure, a coil shaped tissue marker clip was deployed into the biopsy cavity.  SITE 2: Left axillary lymph node (HydroMARK clip)  Lesion quadrant: Upper outer quadrant  Using sterile technique and 1% Lidocaine as local anesthetic, under direct ultrasound visualization, a 14 gauge spring-loaded device was used to perform biopsy of an enlarged left axillary lymph node using an inferior approach. At the conclusion of the procedure, a HydroMARK tissue marker clip was deployed  into the biopsy cavity.  Follow up 2 view mammogram was performed and dictated separately.  IMPRESSION: Ultrasound guided biopsy of a left breast mass and a left axillary lymph node. No apparent complications.  Electronically Signed: By: Meghana Konanur M.D. On: 04/01/2023 09:56 Addendum  Addendum by Konanur, Meghana, MD on 04/04/2023 4:07 PM EDT ADDENDUM REPORT: 04/04/2023 14:07  ADDENDUM: PATHOLOGY revealed: Site A. BREAST, LEFT 6:00; ULTRASOUND-GUIDED BIOPSY: - INVASIVE MAMMARY CARCINOMA, NO SPECIAL TYPE, WITH NECROSIS. 13 mm in this sample. Grade 2. Ductal carcinoma in situ: Not identified.  Pathology results are CONCORDANT with imaging findings, per Dr. Meghana Konanur.  PATHOLOGY revealed: Site B. 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 Comment: While the cytologic features of the metastatic carcinoma in part B are similar to the carcinoma sampled in part A definite solid papillary morphology is not appreciated in part A. This likely represents sampling bias.  Pathology results are CONCORDANT with imaging findings, per Dr. Meghana Konanur.  Pathology results and recommendations below were discussed with patient by telephone on 04/04/2023. Patient reported biopsy site within normal limits with slight tenderness at the site. Post biopsy care instructions were reviewed, questions were answered and my direct phone number was provided to patient. Patient was instructed to call [facility] if any concerns or questions arise related to the biopsy.  RECOMMENDATIONS: 1. Surgical and oncological consultation. Request for surgical and oncological consultation relayed to Ana Moore RN at [facility] by Linda Nash RN on   04/04/2023.  Pathology results reported by Linda Nash RN on 04/04/2023.  Electronically Signed By: Meghana Konanur M.D. On:  04/04/2023 14:07  CLINICAL DATA: LEFT breast pain.   EXAM:  DIGITAL DIAGNOSTIC BILATERAL MAMMOGRAM WITH TOMOSYNTHESIS;  ULTRASOUND LEFT BREAST LIMITED   TECHNIQUE:  Bilateral digital diagnostic mammography and breast tomosynthesis  was performed.; Targeted ultrasound examination of the left breast  was performed.   COMPARISON: Previous exam(s).   ACR Breast Density Category b: There are scattered areas of  fibroglandular density.   FINDINGS:  Diagnostic images were obtained of the LEFT breast. There is a flame  shaped focal asymmetry in the LEFT retroareolar breast. However in  addition, there is a more focal masslike density along the inferior  aspect of the retroareolar breast. It is best seen on spot CC slice  40, ML slice 28, MLO slice 46.   No suspicious mass, distortion, or microcalcifications are  identified to suggest presence of malignancy in the RIGHT breast.   On physical exam, there is asymmetric firmness of the inferior LEFT  breast.   Targeted ultrasound was performed of the LEFT retroareolar breast.  There is a focal hypoechoic masslike area with irregular borders in  the 6 o'clock retroareolar breast. It measures approximately 12 by  11 x 10 mm. This corresponds to the masslike area noted  mammographically. More planar appearing gynecomastia is noted in the  retroareolar breast.   Physical exam was performed of the LEFT axilla. There is a mobile  mass appreciated in the LEFT axilla. Ultrasound was subsequently  performed. There is a markedly enlarged axillary lymph node with  internal cystic foci most consistent with necrosis. There is a  second enlarged axillary lymph node immediately adjacent. Cortical  thickness is at least 16 mm. No discrete echogenic hila are noted.  Several benign appearing LEFT axillary lymph nodes are noted  superiorly to these 2 adjacent lymph nodes.   IMPRESSION:  1. There are 2 suspicious LEFT axillary lymph nodes. Patient  reports  a history of remote LEFT shoulder melanoma. Recommend  ultrasound-guided biopsy for definitive characterization to evaluate  for metastatic disease versus lymphoproliferative disorder.  2. There is a 12 mm mass in the LEFT breast at 6 o'clock which is  indeterminate. While this may reflect nodular gynecomastia,  malignancy is not excluded. Recommend LEFT breast ultrasound-guided  biopsy.  3. No mammographic evidence of malignancy in the RIGHT breast.   RECOMMENDATION:  LEFT breast ultrasound-guided biopsy x2   I have discussed the findings and recommendations with the patient.  The biopsy procedure was discussed with the patient and questions  were answered. Patient expressed their understanding of the biopsy  recommendation. Patient will be scheduled for biopsy at her earliest  convenience by the schedulers. Ordering provider will be notified.  If applicable, a reminder letter will be sent to the patient  regarding the next appointment.   BI-RADS CATEGORY 5: Highly suggestive of malignancy.   Electronically Signed  By: Stephanie Peacock M.D.  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 We specifically discussed how extent of dissection likely will be similar to a full axillary dissection due to size of lymph node already involved.  Plan:    1. Malignant neoplasm   of central portion of left breast in male, estrogen receptor positive (CMS/HHS-HCC) [C50.122, Z17.0] Discussed the risk of surgery including recurrence, chronic pain, post-op infxn, poor/delayed wound healing, poor cosmesis, seroma, hematoma formation, and possible re-operation to address said risks. The risks of  general anesthetic, if used, includes MI, CVA, sudden death or even reaction to anesthetic medications also discussed.  Typical post-op recovery time and possbility of activity restrictions were also discussed. Alternatives include continued observation. Benefits include possible symptom relief, pathologic evaluation, and/or curative excision.   The patient verbalized understanding and all questions were answered to the patient's satisfaction.  2. Patient has elected to proceed with surgical treatment. Procedure will be scheduled. Left mastectomy, axillary lymph node dissection, AFTER US assessment of right breast mass palpated on exam today. Case discussed with Dr. Peacock and she stated US examination appropriate for a palpable mass.  Once US clears of any pathology on right, will proceed with scheduling surgery for known left breast Cancer.  He is also pending genetics counseling. No further workup needed in the meantime after discussion of oncology   labs/images/medications/previous chart entries reviewed personally and relevant changes/updates noted above.  

## 2023-05-05 NOTE — Transfer of Care (Signed)
Immediate Anesthesia Transfer of Care Note  Patient: Juan Calhoun  Procedure(s) Performed: TOTAL MASTECTOMY w/ RF tag breast and axillary (Left: Breast) AXILLARY LYMPH NODE DISSECTION (Left: Breast)  Patient Location: PACU  Anesthesia Type:General  Level of Consciousness: drowsy  Airway & Oxygen Therapy: Patient Spontanous Breathing and Patient connected to face mask oxygen  Post-op Assessment: Report given to RN and Post -op Vital signs reviewed and stable  Post vital signs: Reviewed  Last Vitals:  Vitals Value Taken Time  BP 157/83 05/05/23 1700  Temp 36.2 C 05/05/23 1657  Pulse 79 05/05/23 1713  Resp 26 05/05/23 1713  SpO2 97 % 05/05/23 1713  Vitals shown include unvalidated device data.  Last Pain:  Vitals:   05/05/23 1657  TempSrc:   PainSc: Asleep         Complications: No notable events documented.

## 2023-05-05 NOTE — Anesthesia Procedure Notes (Signed)
Procedure Name: Intubation Date/Time: 05/05/2023 2:41 PM  Performed by: Merlene Pulling, CRNAPre-anesthesia Checklist: Patient identified, Patient being monitored, Timeout performed, Emergency Drugs available and Suction available Patient Re-evaluated:Patient Re-evaluated prior to induction Oxygen Delivery Method: Circle system utilized Preoxygenation: Pre-oxygenation with 100% oxygen Induction Type: IV induction Ventilation: Mask ventilation without difficulty Laryngoscope Size: Mac and 4 Grade View: Grade II Tube type: Oral Tube size: 7.0 mm Number of attempts: 1 Airway Equipment and Method: Stylet Placement Confirmation: ETT inserted through vocal cords under direct vision, positive ETCO2 and breath sounds checked- equal and bilateral Secured at: 22 cm Tube secured with: Tape Dental Injury: Teeth and Oropharynx as per pre-operative assessment

## 2023-05-05 NOTE — Anesthesia Postprocedure Evaluation (Signed)
Anesthesia Post Note  Patient: Juan Calhoun  Procedure(s) Performed: TOTAL MASTECTOMY w/ RF tag breast and axillary (Left: Breast) AXILLARY LYMPH NODE DISSECTION (Left: Breast)  Patient location during evaluation: PACU Anesthesia Type: General Level of consciousness: awake and alert Pain management: pain level controlled Vital Signs Assessment: post-procedure vital signs reviewed and stable Respiratory status: spontaneous breathing, nonlabored ventilation, respiratory function stable and patient connected to nasal cannula oxygen Cardiovascular status: blood pressure returned to baseline and stable Postop Assessment: no apparent nausea or vomiting Anesthetic complications: no  No notable events documented.   Last Vitals:  Vitals:   05/05/23 1745 05/05/23 2006  BP: 130/79 125/67  Pulse: 70 73  Resp: 14 18  Temp: (!) 36.3 C 36.4 C  SpO2: 96% 97%    Last Pain:  Vitals:   05/05/23 2006  TempSrc: Temporal  PainSc:                  Stephanie Coup

## 2023-05-05 NOTE — Anesthesia Preprocedure Evaluation (Addendum)
Anesthesia Evaluation  Patient identified by MRN, date of birth, ID band Patient awake    Reviewed: Allergy & Precautions, NPO status , Patient's Chart, lab work & pertinent test results  History of Anesthesia Complications Negative for: history of anesthetic complications  Airway Mallampati: I   Neck ROM: Full    Dental no notable dental hx.    Pulmonary former smoker (quit 1986)   Pulmonary exam normal breath sounds clear to auscultation       Cardiovascular hypertension, Normal cardiovascular exam Rhythm:Regular Rate:Normal  ECG 05/03/23:  Normal sinus rhythm Right bundle branch block   Neuro/Psych Chronic back pain    GI/Hepatic negative GI ROS,,,  Endo/Other  diabetes, Type 2    Renal/GU Renal disease (stage III CKD)     Musculoskeletal  (+) Arthritis ,    Abdominal   Peds  Hematology negative hematology ROS (+)   Anesthesia Other Findings Last dose of Trulicity 04/24/23.  Reproductive/Obstetrics                             Anesthesia Physical Anesthesia Plan  ASA: 2  Anesthesia Plan: General   Post-op Pain Management:    Induction: Intravenous  PONV Risk Score and Plan: 2 and Ondansetron, Dexamethasone and Treatment may vary due to age or medical condition  Airway Management Planned: Oral ETT  Additional Equipment:   Intra-op Plan:   Post-operative Plan: Extubation in OR  Informed Consent: I have reviewed the patients History and Physical, chart, labs and discussed the procedure including the risks, benefits and alternatives for the proposed anesthesia with the patient or authorized representative who has indicated his/her understanding and acceptance.     Dental advisory given  Plan Discussed with: CRNA  Anesthesia Plan Comments: (Patient consented for risks of anesthesia including but not limited to:  - adverse reactions to medications - damage to eyes, teeth,  lips or other oral mucosa - nerve damage due to positioning  - sore throat or hoarseness - damage to heart, brain, nerves, lungs, other parts of body or loss of life  Informed patient about role of CRNA in peri- and intra-operative care.  Patient voiced understanding.)        Anesthesia Quick Evaluation

## 2023-05-06 ENCOUNTER — Encounter: Payer: Self-pay | Admitting: Surgery

## 2023-05-06 DIAGNOSIS — C50122 Malignant neoplasm of central portion of left male breast: Secondary | ICD-10-CM | POA: Diagnosis not present

## 2023-05-06 LAB — BASIC METABOLIC PANEL
Anion gap: 6 (ref 5–15)
BUN: 20 mg/dL (ref 8–23)
CO2: 21 mmol/L — ABNORMAL LOW (ref 22–32)
Calcium: 8.4 mg/dL — ABNORMAL LOW (ref 8.9–10.3)
Chloride: 110 mmol/L (ref 98–111)
Creatinine, Ser: 1.19 mg/dL (ref 0.61–1.24)
GFR, Estimated: >60 mL/min (ref >60)
Glucose, Bld: 174 mg/dL — ABNORMAL HIGH (ref 70–99)
Potassium: 4.2 mmol/L (ref 3.5–5.1)
Sodium: 137 mmol/L (ref 135–145)

## 2023-05-06 LAB — CBC
HCT: 41.9 % (ref 39.0–52.0)
Hemoglobin: 14.1 g/dL (ref 13.0–17.0)
MCH: 30.7 pg (ref 26.0–34.0)
MCHC: 33.7 g/dL (ref 30.0–36.0)
MCV: 91.3 fL (ref 80.0–100.0)
Platelets: 169 10*3/uL (ref 150–400)
RBC: 4.59 MIL/uL (ref 4.22–5.81)
RDW: 12.1 % (ref 11.5–15.5)
WBC: 14.9 10*3/uL — ABNORMAL HIGH (ref 4.0–10.5)
nRBC: 0 % (ref 0.0–0.2)

## 2023-05-06 MED ORDER — GABAPENTIN 300 MG PO CAPS
ORAL_CAPSULE | ORAL | Status: AC
  Filled 2023-05-06: qty 1

## 2023-05-06 MED ORDER — DOCUSATE SODIUM 100 MG PO CAPS: 100.0000 mg | ORAL_CAPSULE | Freq: Two times a day (BID) | ORAL | 0 refills | Status: AC | PRN

## 2023-05-06 MED ORDER — HYDROCODONE-ACETAMINOPHEN 5-325 MG PO TABS: 1.0000 | ORAL_TABLET | Freq: Four times a day (QID) | ORAL | 0 refills | Status: DC | PRN

## 2023-05-06 MED ORDER — ACETAMINOPHEN 325 MG PO TABS: 650.0000 mg | ORAL_TABLET | Freq: Three times a day (TID) | ORAL | 0 refills | Status: AC | PRN

## 2023-05-06 MED ORDER — CELECOXIB 200 MG PO CAPS
ORAL_CAPSULE | ORAL | Status: AC
  Filled 2023-05-06: qty 1

## 2023-05-06 NOTE — Op Note (Signed)
Preoperative diagnosis: Left breast Cancer.  Postoperative diagnosis: same.   Procedure: Modified radical mastectomy, axillary dissection  Anesthesia: GETA  Surgeon: Dr. Tonna Boehringer  Wound Classification: Clean  Specimen: Left breast, left axillary lymph nodes  Complications: None  Estimated Blood Loss: 25 mL   Indications: See H&P  Findings: Golf ball sized matted lymph nodes within the axilla, palpable nodule within left breast.  Operation performed with curative intent:Yes  Tracer(s) used to identify sentinel nodes in the upfront surgery (non-neoadjuvant) setting (select all that apply):N/A  Tracer(s) used to identify sentinel nodes in the neoadjuvant setting (select all that apply):N/A  All nodes (colored or non-colored) present at the end of a dye-filled lymphatic channel were removed:N/A  All significantly radioactive nodes were removed:N/A  All palpable suspicious nodes were removed:Yes  Biopsy-proven positive nodes marked with clips prior to chemotherapy were identified and removed:N/A   Description of procedure: The patient was brought to the operating room and general anesthesia was induced. A time-out was completed verifying correct patient, procedure, site, positioning, and implant(s) and/or special equipment prior to beginning this procedure. The breast, chest wall, axilla, and upper arm and neck were prepped and draped in the usual sterile fashion.  A skin incision was made that encompassed the nipple-areola complex and the previous biopsy scar and passed in an oblique direction across the breast.    Skin flaps were raised in the avascular plane between subcutaneous tissue and breast tissue from the clavicle superiorly, the sternum medially, the anterior rectus sheath inferiorly, and past the lateral border of the pectoralis major muscle laterally. Hemostasis was achieved in the flaps. Next, the breast tissue and underlying pectoralis fascia were excised from the  pectoralis major muscle, progressing from medially to laterally. At the lateral border of the pectoralis major muscle, the breast tissue was swung laterally and a lateral pedicle identified where breast tissue gave way to fat of axilla. The lateral pedicle was incised and the specimen removed.   Specimen marked long lateral, short superior and sent to pathology.  Attended to continue the axillary dissection to the initial elliptical incision but this proved to be difficult due to limited visualization.  An additional incision made within the axilla for better visualization of the extremely large and palpable lymph nodes within the axilla.  Extensive scarring and adhesions to the surrounding structures were noted during attempt dissection of the axillary fat pad with the obviously metastatic nodes.  Meticulous dissection and hemostasis performed with suture ligation of vessels as needed.  Axillary fat pad was eventually removed and passed off field pending pathology including the obviously palpable lymph nodes.  Reinspection area noted additional vessels that were suture-ligated with 3-0 silk and visualization of the chest wall and the axillary vein and the cephalad portion of the dissection.  No additional identifiable lymph nodes were noted in the area, and the long thoracic nerve unfortunately was not able to be distinguished due to the distorted anatomy.   Wounds irrigated and hemostasis once again confirmed.  15 French round drain brought into the operative field through a separate stab incision and sutured to the skin with a 3-0 nylon suture. The wounds closed with interrupted 3-0 Vicryl to the subcutaneous layer, followed by staples for the skin.  Wounds dressed with honeycomb.  Drain site dressed with drain sponge and paper tape.  The patient tolerated the procedure well and was taken to the postanesthesia care unit in stable condition.

## 2023-05-06 NOTE — Discharge Summary (Signed)
Physician Discharge Summary  Patient ID: Juan Calhoun MRN: [id number] DOB/AGE: [birth year] 80 y.o.  Admit date: 05/05/2023 Discharge date: 05/06/2023  Admission Diagnoses: Left breast cancer  Discharge Diagnoses:  Same as above  Discharged Condition: good  Hospital Course: admitted for above. Underwent surgery.  Please see op note for details.  Post op, recovered as expected.  Some sanguinous drainage but only 60 mL over night.  No sign of hematoma formation.  At time of d/c, tolerating diet and pain controlled  Consults: None  Discharge Exam: Blood pressure 97/66, pulse 65, temperature 98.8 F (37.1 C), resp. rate 18, height 5\' 11"  (1.803 m), weight 88.5 kg, SpO2 93 %. General appearance: alert, cooperative, and no distress Chest wall: no tenderness, left-sided mastectomy site staples clean dry and intact.  Axillary node dissection incision clean dry and intact.  No sign of hematoma formation or swelling.  JP with some old sanguinous drainage.  Disposition:  Discharge disposition: 01-Home or Self Care       Discharge Instructions     Discharge patient   Complete by: As directed    Discharge disposition: 01-Home or Self Care   Discharge patient date: 05/06/2023      Allergies as of 05/06/2023   No Known Allergies      Medication List     TAKE these medications    acetaminophen 325 MG tablet Commonly known as: Tylenol Take 2 tablets (650 mg total) by mouth every 8 (eight) hours as needed for mild pain.   atorvastatin 40 MG tablet Commonly known as: LIPITOR Take 40 mg by mouth daily.   cyanocobalamin 1000 MCG tablet Commonly known as: VITAMIN B12 Take 1,000 mcg by mouth daily.   docusate sodium 100 MG capsule Commonly known as: Colace Take 1 capsule (100 mg total) by mouth 2 (two) times daily as needed for up to 10 days for mild constipation.   HYDROcodone-acetaminophen 5-325 MG tablet Commonly known as: Norco Take 1 tablet by mouth every 6 (six) hours  as needed for up to 6 doses for moderate pain.   ketoconazole 2 % shampoo Commonly known as: NIZORAL Apply 1 Application topically as needed.   losartan 50 MG tablet Commonly known as: COZAAR Take 50 mg by mouth daily.   triamcinolone ointment 0.1 % Commonly known as: KENALOG Apply 1 Application topically 2 (two) times daily as needed.   Trulicity 3 MG/0.5ML Sopn Generic drug: Dulaglutide Inject 3 mg into the skin once a week. Sunday        Follow-up Information     Joeph Szatkowski, DO Follow up in 10 day(s).   Specialty: Surgery Why: mastectomy, drain and possible staple removal  05/11/2023 3:15 PM  Post Op Visit Contact information: [address] [place] [address] [phone]                  Total time spent arranging discharge was >85min. Signed: Sung Amabile 05/06/2023, 1:12 PM

## 2023-05-06 NOTE — Progress Notes (Signed)
DISCHARGE NOTE:  Pt and wife given discharge instructions and verbalized understanding. JP drain, dressing clean and dry.    Pt, wife and daughter educated on JP drain emptying and charting drain and they verbalized understanding. Pt wheeled to car by staff. Family providing transportation.

## 2023-05-06 NOTE — Progress Notes (Signed)
Pts BP 97/66, MD Tonna Boehringer notified, scheduled Losartan held.  Per MD ok to discharge.

## 2023-05-06 NOTE — Discharge Instructions (Signed)
BREAST SURGERY, Care After This sheet gives you information about how to care for yourself after your procedure. Your doctor may also give you more specific instructions. If you have problems or questions, contact your doctor. Follow these instructions at home: Care for cuts from surgery (incisions)    Follow instructions from your doctor about how to take care of your cuts from surgery. Make sure you: Wash your hands with soap and water before you change your bandage (dressing). If you cannot use soap and water, use hand sanitizer. Change your bandage as told by your doctor. Leave stitches (sutures), skin glue, or skin tape (adhesive) strips in place. They may need to stay in place for 2 weeks or longer. If tape strips get loose and curl up, you may trim the loose edges. Do not remove tape strips completely unless your doctor says it is okay. Do not take baths, swim, or use a hot tub until your doctor says it is okay. OK TO SHOWER IN 24HRS FROM DATE OF SURGERY.  Check your surgical cut area every day for signs of infection. Check for: More redness, swelling, or pain. More fluid or blood. Warmth. Pus or a bad smell. Activity Do not drive or use heavy machinery while taking prescription pain medicine. Do not play contact sports until your doctor says it is okay. Do not drive for 24 hours if you were given a medicine to help you relax (sedative). Rest as needed. Do not return to work or school until your doctor says it is okay. Wear supportive bras as tolerated to minimize discomfort General instructions  tylenol and advil as needed for discomfort.  Please alternate between the two every four hours as needed for pain.    Use narcotics, if prescribed, only when tylenol and motrin is not enough to control pain.  325-650mg every 8hrs to max of 4000mg/24hrs (including the 325mg in every norco dose) for the tylenol.    Advil up to 800mg per dose every 8hrs as needed for pain.   To prevent or  treat constipation while you are taking prescription pain medicine, your doctor may recommend that you: Drink enough fluid to keep your pee (urine) clear or pale yellow. Take over-the-counter or prescription medicines. Eat foods that are high in fiber, such as fresh fruits and vegetables, whole grains, and beans. Limit foods that are high in fat and processed sugars, such as fried and sweet foods. Contact a doctor if: You develop a rash. You have more redness, swelling, or pain around your surgical cuts. You have more fluid or blood coming from your surgical cuts. Your surgical cuts feel warm to the touch. You have pus or a bad smell coming from your surgical cuts. You have a fever. One or more of your surgical cuts breaks open. You have trouble breathing. You have chest pain. You have pain that is getting worse in your shoulders. You faint or feel dizzy when you stand. You have very bad pain in your belly (abdomen). You are sick to your stomach (nauseous) for more than one day. You have throwing up (vomiting) that lasts for more than one day. You have leg pain. This information is not intended to replace advice given to you by your health care provider. Make sure you discuss any questions you have with your health care provider.  

## 2023-05-06 NOTE — Plan of Care (Signed)
  Problem: Activity: Goal: Risk for activity intolerance will decrease Outcome: Progressing   Problem: Nutrition: Goal: Adequate nutrition will be maintained Outcome: Progressing   Problem: Coping: Goal: Level of anxiety will decrease Outcome: Progressing   

## 2023-05-09 ENCOUNTER — Other Ambulatory Visit: Payer: Self-pay | Admitting: Pathology

## 2023-05-09 ENCOUNTER — Encounter: Payer: Self-pay | Admitting: *Deleted

## 2023-05-09 LAB — SURGICAL PATHOLOGY

## 2023-05-09 NOTE — Progress Notes (Signed)
Oncotype Dx order submitted online on path [id number]

## 2023-05-17 ENCOUNTER — Encounter: Payer: Self-pay | Admitting: Oncology

## 2023-05-18 ENCOUNTER — Inpatient Hospital Stay: Payer: Medicare Other | Admitting: Occupational Therapy

## 2023-05-18 ENCOUNTER — Encounter: Payer: Self-pay | Admitting: *Deleted

## 2023-05-18 ENCOUNTER — Encounter: Payer: Self-pay | Admitting: Oncology

## 2023-05-18 DIAGNOSIS — Z9012 Acquired absence of left breast and nipple: Secondary | ICD-10-CM

## 2023-05-18 DIAGNOSIS — C50922 Malignant neoplasm of unspecified site of left male breast: Secondary | ICD-10-CM

## 2023-05-18 NOTE — Therapy (Signed)
[facility] at [facility] [address], [address] [place], [place], [address] Phone: [phone]   Fax:  [phone]  Occupational Therapy Screen  Patient Details  Name: Juan Calhoun MRN: [id number] Date of Birth: April 17, 1941 No data recorded  Encounter Date: 05/18/2023   OT End of Session - 05/18/23 0939     Visit Number 0             Past Medical History:  Diagnosis Date   Cataract cortical, senile    Chronic back pain    CKD (chronic kidney disease) stage 3, GFR 30-59 ml/min (HCC)    Diabetes mellitus without complication (HCC)    External hemorrhoids    Hypercholesterolemia    Hyperlipidemia    Hyperplastic colon polyp    Hypertension    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   Osteoarthritis     Past Surgical History:  Procedure Laterality Date   APPENDECTOMY     AXILLARY LYMPH NODE DISSECTION Left 05/05/2023   Procedure: AXILLARY LYMPH NODE DISSECTION;  Surgeon: Sung Amabile, DO;  Location: [facility] ORS;  Service: General;  Laterality: Left;   BREAST BIOPSY Left 04/01/2023   u/s bx, COIL clip- [facility]   BREAST BIOPSY Left 03/31/2023   u/s bx axilla-HYDROMARK(coil) METASTATIC   BREAST BIOPSY Left 04/01/2023   [place] LT BREAST BX W LOC DEV 1ST LESION IMG BX SPEC US GUIDE 04/01/2023 [facility]   BREAST BIOPSY Right 04/20/2023   (NOT DONE)    [place] RT BREAST BX W LOC DEV 1ST LESION IMG BX SPEC US GUIDE 04/20/2023 [facility]   BREAST BIOPSY Left 05/03/2023   [place] LT RADIO FREQUENCY TAG LOC US GUIDE 05/03/2023 [facility]   CATARACT EXTRACTION Bilateral    CHOLECYSTECTOMY     COLONOSCOPY  01/27/2021   COLONOSCOPY WITH PROPOFOL N/A 09/19/2015   Procedure: COLONOSCOPY WITH PROPOFOL;  Surgeon: Elnita Maxwell, MD;  Location: [facility];  Service: Endoscopy;  Laterality: N/A;   TOTAL MASTECTOMY Left 05/05/2023   Procedure: TOTAL MASTECTOMY w/ RF tag breast and axillary;  Surgeon: Sung Amabile, DO;   Location: [facility] OR[facility];  Service: General;  Laterality: Left;    There were no vitals filed for this visit.   Subjective Assessment - 05/18/23 0939     Subjective  Doing well - seen surgeon yesterday -and not really any pain -he did not really gave me any precautions    Currently in Pain? No/denies               [facility] OT Assessment - 05/18/23 0001       AROM   Left Shoulder Extension 50 Degrees    Left Shoulder Flexion 130 Degrees    Left Shoulder ABduction 120 Degrees              LYMPHEDEMA/ONCOLOGY QUESTIONNAIRE - 05/18/23 0001       Left Upper Extremity Lymphedema   15 cm Proximal to Olecranon Process 29 cm    10 cm Proximal to Olecranon Process 27 cm    Olecranon Process 26.5 cm    15 cm Proximal to Ulnar Styloid Process 24.5 cm    10 cm Proximal to Ulnar Styloid Process 21.2 cm    Just Proximal to Ulnar Styloid Process 18 cm             DR Tonna Boehringer- note  05/18/23 Assessment:    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 Healing . Staples removed, No issues. Recommended to continue to monitor incision line as healing continues along the thin necrotic area. The dry eschar likely will fall off as healing continues. No sign of infection today. F/u prn.   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

## 2023-05-18 NOTE — Addendum Note (Signed)
Addended by: Hulen Luster on: 05/18/2023 09:35 AM   Modules accepted: Orders

## 2023-05-18 NOTE — Progress Notes (Signed)
Met with [name] when he was seeing Marisue Humble today.  Told him his oncotype score came back low and he will not need chemo, but he will need to see Dr. Rushie Chestnut for radiation.  He will see him on 6/5 at 9:30.

## 2023-05-25 ENCOUNTER — Ambulatory Visit: Payer: Medicare Other | Admitting: Occupational Therapy

## 2023-05-25 ENCOUNTER — Ambulatory Visit: Payer: Medicare Other | Admitting: Oncology

## 2023-05-27 ENCOUNTER — Encounter: Payer: Self-pay | Admitting: Oncology

## 2023-05-27 ENCOUNTER — Inpatient Hospital Stay (HOSPITAL_BASED_OUTPATIENT_CLINIC_OR_DEPARTMENT_OTHER): Payer: Medicare Other | Admitting: Oncology

## 2023-05-27 VITALS — BP 109/59 | HR 83 | Temp 97.6°F | Wt 200.6 lb

## 2023-05-27 DIAGNOSIS — Z803 Family history of malignant neoplasm of breast: Secondary | ICD-10-CM | POA: Diagnosis not present

## 2023-05-27 DIAGNOSIS — Z87891 Personal history of nicotine dependence: Secondary | ICD-10-CM | POA: Diagnosis not present

## 2023-05-27 DIAGNOSIS — Z170 Estrogen receptor positive status [ER+]: Secondary | ICD-10-CM | POA: Diagnosis not present

## 2023-05-27 DIAGNOSIS — C50922 Malignant neoplasm of unspecified site of left male breast: Secondary | ICD-10-CM | POA: Diagnosis present

## 2023-05-27 NOTE — Progress Notes (Signed)
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  INTERVAL HISTORY: Patient returns to clinic today for further evaluation, discussion of his final pathology results, and treatment planning.  He tolerated his mastectomy well without significant side effects.  He currently feels well and is asymptomatic. He has no neurologic complaints.  He denies any recent fevers or illnesses.  He has a good appetite and denies weight loss.  He has no chest pain, shortness of breath, cough, or hemoptysis.  He denies any nausea, vomiting, constipation, or diarrhea.  He has no urinary complaints.  Patient offers no specific complaints today.  REVIEW OF SYSTEMS:   Review of Systems  Constitutional: Negative.  Negative for fever, malaise/fatigue and weight loss.  Respiratory: Negative.  Negative for cough, hemoptysis and shortness of breath.   Cardiovascular: Negative.  Negative for chest pain and leg swelling.  Gastrointestinal: Negative.  Negative for abdominal pain.  Genitourinary: Negative.  Negative for dysuria.  Musculoskeletal: Negative.  Negative for back pain.  Skin: Negative.  Negative for rash.  Neurological: Negative.  Negative for dizziness, focal weakness, weakness and headaches.  Psychiatric/Behavioral: Negative.  The patient is not nervous/anxious.     As per HPI. Otherwise, a complete review of systems is negative.  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   Osteoarthritis     PAST SURGICAL HISTORY: Past Surgical History:  Procedure Laterality Date   APPENDECTOMY     AXILLARY LYMPH NODE DISSECTION Left 05/05/2023   Procedure: AXILLARY LYMPH NODE DISSECTION;  Surgeon: Sung Amabile, DO;  Location: [facility] ORS;  Service: General;  Laterality: Left;   BREAST BIOPSY Left 04/01/2023   u/s bx, COIL clip- [facility]   BREAST BIOPSY Left 03/31/2023   u/s bx axilla-HYDROMARK(coil) METASTATIC   BREAST BIOPSY Left 04/01/2023   [place] LT BREAST BX W LOC DEV 1ST LESION IMG BX SPEC US GUIDE 04/01/2023 [facility]   BREAST BIOPSY Right 04/20/2023   (NOT DONE)    [place] RT BREAST BX W LOC DEV 1ST LESION IMG BX SPEC US GUIDE 04/20/2023 [facility]   BREAST BIOPSY Left 05/03/2023   [place] LT RADIO FREQUENCY TAG LOC US GUIDE 05/03/2023 [facility]   CATARACT EXTRACTION Bilateral    CHOLECYSTECTOMY     COLONOSCOPY  01/27/2021   COLONOSCOPY WITH PROPOFOL N/A 09/19/2015   Procedure: COLONOSCOPY WITH PROPOFOL;  Surgeon: Elnita Maxwell, MD;  Location: [facility] ENDOSCOPY;  Service: Endoscopy;  Laterality: N/A;   TOTAL MASTECTOMY Left 05/05/2023   Procedure: TOTAL MASTECTOMY w/ RF tag breast and axillary;  Surgeon: Sung Amabile, DO;  Location: [facility] ORS;  Service: General;  Laterality: Left;    FAMILY HISTORY: Family History  Problem Relation Age of Onset   Breast cancer Mother    Heart disease Father    Heart attack Brother  Heart disease Maternal Uncle     ADVANCED DIRECTIVES (Y/N):  N  HEALTH MAINTENANCE: Social History   Tobacco Use   Smoking status: Former    Types: Cigarettes    Quit date: 1986    Years since quitting: 38.4   Smokeless tobacco: Never  Vaping Use   Vaping Use: Never used  Substance Use Topics   Alcohol use: No   Drug use: No     Colonoscopy:  PAP:  Bone density:  Lipid panel:  No  Known Allergies  Current Outpatient Medications  Medication Sig Dispense Refill   atorvastatin (LIPITOR) 40 MG tablet Take 40 mg by mouth daily.     cyanocobalamin (VITAMIN B12) 1000 MCG tablet Take 1,000 mcg by mouth daily.     ketoconazole (NIZORAL) 2 % shampoo Apply 1 Application topically as needed.     triamcinolone ointment (KENALOG) 0.1 % Apply 1 Application topically 2 (two) times daily as needed.     TRULICITY 3 MG/0.5ML SOPN Inject 3 mg into the skin once a week. Sunday     acetaminophen (TYLENOL) 325 MG tablet Take 2 tablets (650 mg total) by mouth every 8 (eight) hours as needed for mild pain. 40 tablet 0   HYDROcodone-acetaminophen (NORCO) 5-325 MG tablet Take 1 tablet by mouth every 6 (six) hours as needed for up to 6 doses for moderate pain. 6 tablet 0   losartan (COZAAR) 50 MG tablet Take 50 mg by mouth daily.     No current facility-administered medications for this visit.    OBJECTIVE: Vitals:   05/27/23 1023  BP: (!) 109/59  Pulse: 83  Temp: 97.6 F (36.4 C)  SpO2: 97%     Body mass index is 27.98 kg/m.    ECOG FS:0 - Asymptomatic  General: Well-developed, well-nourished, no acute distress. Eyes: Pink conjunctiva, anicteric sclera. HEENT: Normocephalic, moist mucous membranes. Breast: Well-healing mastectomy scar. Lungs: No audible wheezing or coughing. Heart: Regular rate and rhythm. Abdomen: Soft, nontender, no obvious distention. Musculoskeletal: No edema, cyanosis, or clubbing. Neuro: Alert, answering all questions appropriately. Cranial nerves grossly intact. Skin: No rashes or petechiae noted. Psych: Normal affect.   LAB RESULTS:  Lab Results  Component Value Date   NA 137 05/06/2023   K 4.2 05/06/2023   CL 110 05/06/2023   CO2 21 (L) 05/06/2023   GLUCOSE 174 (H) 05/06/2023   BUN 20 05/06/2023   CREATININE 1.19 05/06/2023   CALCIUM 8.4 (L) 05/06/2023   GFRNONAA >60 05/06/2023    Lab Results  Component Value Date   WBC 14.9 (H)  05/06/2023   HGB 14.1 05/06/2023   HCT 41.9 05/06/2023   MCV 91.3 05/06/2023   PLT 169 05/06/2023     STUDIES: [place] LT RADIO FREQUENCY TAG LOC US GUIDE  Result Date: 05/03/2023 CLINICAL DATA:  Patient with history of biopsy-proven LEFT breast malignancy as well as a metastatic axillary lymph node. Patient presents for preoperative localization of the LEFT axilla. EXAM: NEEDLE LOCALIZATION OF THE LEFT BREAST WITH ULTRASOUND GUIDANCE COMPARISON:  Previous exam(s). FINDINGS: Patient presents for needle localization prior to surgery. I met with the patient and we discussed the procedure of needle localization including benefits and alternatives. We discussed the high likelihood of a successful procedure. We discussed the risks of the procedure, including infection, bleeding, tissue injury, and further surgery. Informed, written consent was given. The usual time-out protocol was performed immediately prior to the procedure. Using ultrasound guidance, sterile technique, 1% lidocaine and a 7 cm radiofrequency  ID tag needle ([id number]), the enlarged LEFT axillary lymph node was localized using an inferolateral approach. Function was confirmed with an auditory signal from the guide. The images were marked for Dr. Tonna Boehringer. IMPRESSION: Radiofrequency ID tag localization of the LEFT axilla. No apparent complications.d Electronically Signed   By: Meda Klinefelter M.D.   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  Oncotype score 4.  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  He has noted to have 2 axillary lymph nodes positive for disease.  Despite this, he had a low risk Oncotype DX score of 4 and therefore adjuvant chemotherapy is not necessary.  He has an appointment with radiation oncology next week to discuss adjuvant  XRT.  At the conclusion of his XRT he will benefit from tamoxifen for a total of 5 years.  Return to clinic the last week of his radiation treatments for further evaluation and initiation of tamoxifen.    I spent a total of 30 minutes reviewing chart data, face-to-face evaluation with the patient, counseling and coordination of care as detailed above.    Patient expressed understanding and was in agreement with this plan. He also understands that He can call clinic at any time with any questions, concerns, or complaints.    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

## 2023-06-01 ENCOUNTER — Ambulatory Visit
Admission: RE | Admit: 2023-06-01 | Discharge: 2023-06-01 | Disposition: A | Discharge status: Home / Self Care | Payer: Medicare Other | Source: Ambulatory Visit | Attending: Radiation Oncology | Admitting: Radiation Oncology

## 2023-06-01 ENCOUNTER — Inpatient Hospital Stay: Payer: Medicare Other | Attending: Oncology | Admitting: Occupational Therapy

## 2023-06-01 ENCOUNTER — Encounter: Payer: Self-pay | Admitting: Radiation Oncology

## 2023-06-01 VITALS — BP 110/70 | HR 68 | Temp 98.5°F | Resp 20 | Ht 72.0 in | Wt 200.0 lb

## 2023-06-01 DIAGNOSIS — Z510 Encounter for antineoplastic radiation therapy: Secondary | ICD-10-CM | POA: Diagnosis present

## 2023-06-01 DIAGNOSIS — C773 Secondary and unspecified malignant neoplasm of axilla and upper limb lymph nodes: Secondary | ICD-10-CM | POA: Insufficient documentation

## 2023-06-01 DIAGNOSIS — Z170 Estrogen receptor positive status [ER+]: Secondary | ICD-10-CM | POA: Insufficient documentation

## 2023-06-01 DIAGNOSIS — Z9012 Acquired absence of left breast and nipple: Secondary | ICD-10-CM

## 2023-06-01 DIAGNOSIS — C50922 Malignant neoplasm of unspecified site of left male breast: Secondary | ICD-10-CM | POA: Diagnosis not present

## 2023-06-01 NOTE — Consult Note (Signed)
NEW PATIENT EVALUATION  Name: Juan Calhoun  MRN: [id number]  Date:   06/01/2023     DOB: [birth year]   This 82 y.o. male patient presents to the clinic for initial evaluation of stage IIb (T1 cN1 M0) invasive mammary carcinoma of the left breast status post left mastectomy and male breast.  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   PREVIOUS INVESTIGATIONS:  Pathology report reviewed Mammograms and ultrasound reviewed Clinical notes reviewed  HPI: Patient is a 82 year old male presented some irritation around the nipple of his left breast.  Mammogram showed 2 suspicious left axillary lymph nodes also a 1.2 cm mass in the left breast 6 o'clock position.  Biopsy of his left breast demonstrated invasive mammary carcinoma of no special type with necrosis.  Ultrasound-guided biopsy of left axillary lymph node was also positive for metastatic mammary carcinoma with focal solid papillary morphology.  Patient underwent left mastectomy showing 1.5 cm overall grade 2 invasive mammary carcinoma with margins clear but close at 2 mm and 2 mm for DCIS.  2 lymph nodes were examined in both contained invasive mammary carcinoma with extranodal extension.  Tumor was ER/PR positive HER2/neu not overexpressed.  He had Oncotype DX performed showing low risk for recurrence.  He is now seen for radiation oncology opinion.  He is doing well specifically denies any chest wall pain or tenderness cough or bone pain.  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    PAST SURGICAL HISTORY:  Past Surgical History:  Procedure Laterality Date   APPENDECTOMY     AXILLARY LYMPH NODE DISSECTION Left 05/05/2023   Procedure: AXILLARY LYMPH NODE DISSECTION;  Surgeon: Sung Amabile, DO;  Location: [facility];  Service: General;  Laterality: Left;   BREAST BIOPSY Left 04/01/2023   u/s bx, COIL clip- [facility]   BREAST BIOPSY Left 03/31/2023   u/s bx axilla-HYDROMARK(coil) METASTATIC   BREAST BIOPSY Left 04/01/2023   [place] LT BREAST BX W LOC DEV 1ST LESION IMG BX SPEC US GUIDE 04/01/2023 [facility]   BREAST BIOPSY Right 04/20/2023   (NOT DONE)    [place] RT BREAST BX W LOC DEV 1ST LESION IMG BX SPEC US GUIDE 04/20/2023 [facility]   BREAST BIOPSY Left 05/03/2023   [place] LT RADIO FREQUENCY TAG LOC US GUIDE 05/03/2023 [facility]   CATARACT EXTRACTION Bilateral    CHOLECYSTECTOMY     COLONOSCOPY  01/27/2021   COLONOSCOPY WITH PROPOFOL N/A 09/19/2015   Procedure: COLONOSCOPY WITH PROPOFOL;  Surgeon: Elnita Maxwell, MD;  [facility];  Service: Endoscopy;  Laterality: N/A;   TOTAL MASTECTOMY Left 05/05/2023   Procedure: TOTAL MASTECTOMY w/ RF tag breast and axillary;  Surgeon: Sung Amabile, DO;  Location: [facility];  Service: General;  Laterality: Left;    FAMILY HISTORY: family history includes Breast cancer in his mother; Heart attack in his brother; Heart disease in his father and maternal uncle.  SOCIAL HISTORY:  reports that he quit smoking about 38 years ago. His smoking use included  cigarettes. He has never used smokeless tobacco. He reports that he does not drink alcohol and does not use drugs.  ALLERGIES: Patient has no known allergies.  MEDICATIONS:  Current Outpatient Medications  Medication Sig Dispense Refill   atorvastatin (LIPITOR) 40 MG tablet Take 40 mg by mouth daily.      cyanocobalamin (VITAMIN B12) 1000 MCG tablet Take 1,000 mcg by mouth daily.     ketoconazole (NIZORAL) 2 % shampoo Apply 1 Application topically as needed.     triamcinolone ointment (KENALOG) 0.1 % Apply 1 Application topically 2 (two) times daily as needed.     TRULICITY 3 MG/0.5ML SOPN Inject 3 mg into the skin once a week. Sunday     acetaminophen (TYLENOL) 325 MG tablet Take 2 tablets (650 mg total) by mouth every 8 (eight) hours as needed for mild pain. 40 tablet 0   HYDROcodone-acetaminophen (NORCO) 5-325 MG tablet Take 1 tablet by mouth every 6 (six) hours as needed for up to 6 doses for moderate pain. 6 tablet 0   losartan (COZAAR) 50 MG tablet Take 50 mg by mouth daily.     No current facility-administered medications for this encounter.    ECOG PERFORMANCE STATUS:  0 - Asymptomatic  REVIEW OF SYSTEMS: Patient denies any weight loss, fatigue, weakness, fever, chills or night sweats. Patient denies any loss of vision, blurred vision. Patient denies any ringing  of the ears or hearing loss. No irregular heartbeat. Patient denies heart murmur or history of fainting. Patient denies any chest pain or pain radiating to her upper extremities. Patient denies any shortness of breath, difficulty breathing at night, cough or hemoptysis. Patient denies any swelling in the lower legs. Patient denies any nausea vomiting, vomiting of blood, or coffee ground material in the vomitus. Patient denies any stomach pain. Patient states has had normal bowel movements no significant constipation or diarrhea. Patient denies any dysuria, hematuria or significant nocturia. Patient denies any problems walking, swelling in the joints or loss of balance. Patient denies any skin changes, loss of hair or loss of weight. Patient denies any excessive worrying or anxiety or significant depression. Patient denies any problems with insomnia. Patient denies excessive thirst, polyuria, polydipsia. Patient denies any swollen  glands, patient denies easy bruising or easy bleeding. Patient denies any recent infections, allergies or URI. Patient "s visual fields have not changed significantly in recent time.   PHYSICAL EXAM: BP 110/70   Pulse 68   Temp 98.5 F (36.9 C) (Tympanic)   Resp 20   Ht 6' (1.829 m) Comment: stated wt  Wt 200 lb (90.7 kg)   BMI 27.12 kg/m  Patient is status post left mastectomy.  Incision is healing well.  No other chest wall masses or nodularity are noted.  No axillary or supraclavicular adenopathy is appreciated.  Well-developed well-nourished patient in NAD. HEENT reveals PERLA, EOMI, discs not visualized.  Oral cavity is clear. No oral mucosal lesions are identified. Neck is clear without evidence of cervical or supraclavicular adenopathy. Lungs are clear to A&P. Cardiac examination is essentially unremarkable with regular rate and rhythm without murmur rub or thrill. Abdomen is benign with no organomegaly or masses noted. Motor sensory and DTR levels are equal and symmetric in the upper and lower extremities. Cranial nerves II through XII are grossly intact. Proprioception is intact. No peripheral adenopathy or edema is identified. No motor or sensory levels are noted. Crude visual fields are within normal range.  LABORATORY DATA: Pathology reports reviewed  RADIOLOGY RESULTS:mammogram and ultrasound reviewed compatible with above-stated findings   IMPRESSION: Stage IIb invasive mammary carcinoma left male breast status postmastectomy in 82 year old male  PLAN: At the present time I have recommended left chest wall and peripheral lymphatic radiation.  Would plan on delivering 5040 cGy in 28 fractions to both areas.  Would also boost his scar based on the close margin as well as any abnormal lymph nodes I see at time of CT simulation.  Risks and benefits of treatment including skin reaction fatigue alteration of blood counts possible lymphedema of the left upper extremity possible  inclusion of superficial lung all were discussed in detail with the patient.  He comprehends my recommendations well.  I personally set up and ordered CT simulation.  Patient will also benefit from endocrine therapy after radiation.  I would like to take this opportunity to thank you for allowing me to participate in the care of your patient.Carmina Miller, MD

## 2023-06-01 NOTE — Therapy (Signed)
[facility] at [facility] [address] [place], [address] [place], [place], [address] Phone: [phone]   Fax:  [phone]  Occupational Therapy Screen  Patient Details  Name: Juan Calhoun MRN: [id number] Date of Birth: Jul 15, 1941 No data recorded  Encounter Date: 06/01/2023   OT End of Session - 06/01/23 1931     Visit Number 0             Past Medical History:  Diagnosis Date   Cataract cortical, senile    Chronic back pain    CKD (chronic kidney disease) stage 3, GFR 30-59 ml/min (HCC)    Diabetes mellitus without complication (HCC)    External hemorrhoids    Hypercholesterolemia    Hyperlipidemia    Hyperplastic colon polyp    Hypertension    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   Osteoarthritis     Past Surgical History:  Procedure Laterality Date   APPENDECTOMY     AXILLARY LYMPH NODE DISSECTION Left 05/05/2023   Procedure: AXILLARY LYMPH NODE DISSECTION;  Surgeon: Sung Amabile, DO;  Location: [facility];  Service: General;  Laterality: Left;   BREAST BIOPSY Left 04/01/2023   u/s bx, COIL clip- [facility]   BREAST BIOPSY Left 03/31/2023   u/s bx axilla-HYDROMARK(coil) METASTATIC   BREAST BIOPSY Left 04/01/2023   [place] LT BREAST BX W LOC DEV 1ST LESION IMG BX SPEC US GUIDE 04/01/2023 [facility]   BREAST BIOPSY Right 04/20/2023   (NOT DONE)    [place] RT BREAST BX W LOC DEV 1ST LESION IMG BX SPEC US GUIDE 04/20/2023 [facility]   BREAST BIOPSY Left 05/03/2023   [place] LT RADIO FREQUENCY TAG LOC US GUIDE 05/03/2023 [facility]   CATARACT EXTRACTION Bilateral    CHOLECYSTECTOMY     COLONOSCOPY  01/27/2021   COLONOSCOPY WITH PROPOFOL N/A 09/19/2015   Procedure: COLONOSCOPY WITH PROPOFOL;  Surgeon: Elnita Maxwell, MD;  Location: [facility] ENDOSCOPY;  Service: Endoscopy;  Laterality: N/A;   TOTAL MASTECTOMY Left 05/05/2023   Procedure: TOTAL MASTECTOMY w/ RF tag breast and axillary;  Surgeon: Sung Amabile, DO;   Location: [facility];  Service: General;  Laterality: Left;    There were no vitals filed for this visit.   Subjective Assessment - 06/01/23 1930     Subjective  I am doing okay.  I have my appointment with the radiology today.  Doing better with my motion.  Scar is improving.    Currently in Pain? No/denies                 LYMPHEDEMA/ONCOLOGY QUESTIONNAIRE - 06/01/23 0001       Right Upper Extremity Lymphedema   15 cm Proximal to Olecranon Process 30 cm    10 cm Proximal to Olecranon Process 28 cm    Olecranon Process 26 cm      Left Upper Extremity Lymphedema   15 cm Proximal to Olecranon Process 30 cm    10 cm Proximal to Olecranon Process 27.5 cm    Olecranon Process 26 cm               DR Tonna Boehringer- note  05/18/23 Assessment:    Malignant neoplasm of central portion of left breast in male, estrogen receptor positive (CMS/HHS-HCC) [O96.295, Z17.0] S/p rmodified radical mastectomy, left  Plan:    1. Healing . Staples removed, No issues. Recommended to continue to monitor incision line as healing continues along the thin necrotic area. The dry eschar likely will  fall off as healing continues. No sign of infection today. F/u prn.    OT SCREEN 05/18/23: Pt had L mastectomy on 05/05/23 - doing very well -staples came out yesterday Started doing some motion per pt- no pain Slight pull under arm Circumference compare to R WNL- and same as prior to surgery Incision healing Review with pt AROM using wand in supine for shoulder flexion and ABD pain free- back off when feeling slight pull And gentle external rotation in supine Pt to do 2 -3 x day  Pt to return in 2 wks for follow up Breast navigator came in - pt do not need Chemo but would need radiation     OT SCREEN 06/01/23: Patient present for follow-up after performing active assistive range of motion in supine for shoulder flexion and abduction and external rotation post left mastectomy. Making good progress with  shoulder range of motion. Did add and review with patient using pulleys for active assisted range of motion flexion and extension. Appear patient had some lymphatic cording in the left axilla.  Was able to release 1. Patient continue with active assisted range of motion stretches this coming week prior to initiating radiation next week. Circumference appear still to be within normal limits compared to each other. Left upper arm increased 0.5 continue to monitor.  For lymphedema signs and symptoms. Patient to follow-up with me next week.                                   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

## 2023-06-08 ENCOUNTER — Inpatient Hospital Stay: Payer: Medicare Other | Admitting: Occupational Therapy

## 2023-06-08 DIAGNOSIS — M25612 Stiffness of left shoulder, not elsewhere classified: Secondary | ICD-10-CM

## 2023-06-08 DIAGNOSIS — Z9012 Acquired absence of left breast and nipple: Secondary | ICD-10-CM

## 2023-06-08 NOTE — Therapy (Signed)
[facility] at [facility] [address], [address] [place], [place], [address] Phone: [phone]   Fax:  [phone]  Occupational Therapy Screen  Patient Details  Name: Juan Calhoun MRN: [id number] Date of Birth: 04-08-1941 No data recorded  Encounter Date: 06/08/2023   OT End of Session - 06/08/23 1834     Visit Number 0             Past Medical History:  Diagnosis Date   Cataract cortical, senile    Chronic back pain    CKD (chronic kidney disease) stage 3, GFR 30-59 ml/min (HCC)    Diabetes mellitus without complication (HCC)    External hemorrhoids    Hypercholesterolemia    Hyperlipidemia    Hyperplastic colon polyp    Hypertension    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   Osteoarthritis     Past Surgical History:  Procedure Laterality Date   APPENDECTOMY     AXILLARY LYMPH NODE DISSECTION Left 05/05/2023   Procedure: AXILLARY LYMPH NODE DISSECTION;  Surgeon: Sung Amabile, DO;  Location: [facility] ORS;  Service: General;  Laterality: Left;   BREAST BIOPSY Left 04/01/2023   u/s bx, COIL clip- [facility]   BREAST BIOPSY Left 03/31/2023   u/s bx axilla-HYDROMARK(coil) METASTATIC   BREAST BIOPSY Left 04/01/2023   [place] LT BREAST BX W LOC DEV 1ST LESION IMG BX SPEC US GUIDE 04/01/2023 [facility]   BREAST BIOPSY Right 04/20/2023   (NOT DONE)    [place] RT BREAST BX W LOC DEV 1ST LESION IMG BX SPEC US GUIDE 04/20/2023 [facility]   BREAST BIOPSY Left 05/03/2023   [place] LT RADIO FREQUENCY TAG LOC US GUIDE 05/03/2023 [facility]   CATARACT EXTRACTION Bilateral    CHOLECYSTECTOMY     COLONOSCOPY  01/27/2021   COLONOSCOPY WITH PROPOFOL N/A 09/19/2015   Procedure: COLONOSCOPY WITH PROPOFOL;  Surgeon: Elnita Maxwell, MD;  Location: [facility] ENDOSCOPY;  Service: Endoscopy;  Laterality: N/A;   TOTAL MASTECTOMY Left 05/05/2023   Procedure: TOTAL MASTECTOMY w/ RF tag breast and axillary;  Surgeon: Sung Amabile, DO;   Location: [facility];  Service: General;  Laterality: Left;    There were no vitals filed for this visit.   Subjective Assessment - 06/08/23 1833     Subjective  I am doing well doing those exercises that you told me to do.  Everything better.  Getting my mold done tomorrow for radiation    Currently in Pain? 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 Healing . Staples removed, No issues. Recommended to continue to monitor incision line as healing continues along the thin necrotic area. The dry eschar likely will fall off as healing continues. No sign of infection today. F/u prn.    OT SCREEN 05/18/23: Pt had L mastectomy on 05/05/23 - doing very well -staples came out yesterday Started doing some motion per  pt- no pain Slight pull under arm Circumference compare to R WNL- and same as prior to surgery Incision healing Review with pt AROM using wand in supine for shoulder flexion and ABD pain free- back off when feeling slight pull And gentle external rotation in supine Pt to do 2 -3 x day  Pt to return in 2 wks for follow up Breast navigator came in - pt do not need Chemo but would need radiation      OT SCREEN 06/01/23: Patient present for follow-up after performing active assistive range of motion in supine for shoulder flexion and abduction and external rotation post left mastectomy. Making good progress with shoulder range of motion. Did add and review with patient using pulleys for active assisted range of motion flexion and extension. Appear patient  had some lymphatic cording in the left axilla.  Was able to release 1. Patient continue with active assisted range of motion stretches this coming week prior to initiating radiation next week. Circumference appear still to be within normal limits compared to each other. Left upper arm increased 0.5 continue to monitor.  For lymphedema signs and symptoms. Patient to follow-up with me next week.  06/08/23 OT SCREEN : Patient with good progress for left shoulder abduction and flexion as well as external rotation. Patient doing at home pulleys as well as wand exercises in supine for shoulder flexion and abduction. Reviewed with patient again external rotation. As well as scar massage in left axilla in combination with manual release of cording in left axilla.  Had 2 releases.   Reviewed with patient to continue with AAROM as well as provided him with a table flexion and abduction stretch.    Plan is for patient to start radiation soon. Patient to follow-up with me halfway through radiation-about 3 weeks and. Patient's left circumference decreased to within normal limits compared to the right. 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

## 2023-06-09 ENCOUNTER — Telehealth: Payer: Self-pay | Admitting: *Deleted

## 2023-06-09 ENCOUNTER — Ambulatory Visit
Admission: RE | Admit: 2023-06-09 | Discharge: 2023-06-09 | Disposition: A | Discharge status: Home / Self Care | Payer: Medicare Other | Source: Ambulatory Visit | Attending: Radiation Oncology | Admitting: Radiation Oncology

## 2023-06-09 ENCOUNTER — Encounter: Payer: Self-pay | Admitting: *Deleted

## 2023-06-09 DIAGNOSIS — Z510 Encounter for antineoplastic radiation therapy: Secondary | ICD-10-CM | POA: Diagnosis not present

## 2023-06-09 NOTE — Telephone Encounter (Signed)
error 

## 2023-06-11 DIAGNOSIS — Z510 Encounter for antineoplastic radiation therapy: Secondary | ICD-10-CM | POA: Diagnosis not present

## 2023-06-17 ENCOUNTER — Other Ambulatory Visit: Payer: Self-pay | Admitting: *Deleted

## 2023-06-17 DIAGNOSIS — C50922 Malignant neoplasm of unspecified site of left male breast: Secondary | ICD-10-CM

## 2023-06-20 ENCOUNTER — Ambulatory Visit: Admission: RE | Admit: 2023-06-20 | Payer: Medicare Other | Source: Ambulatory Visit

## 2023-06-20 DIAGNOSIS — Z510 Encounter for antineoplastic radiation therapy: Secondary | ICD-10-CM | POA: Diagnosis not present

## 2023-06-21 ENCOUNTER — Other Ambulatory Visit: Payer: Self-pay

## 2023-06-21 ENCOUNTER — Ambulatory Visit
Admission: RE | Admit: 2023-06-21 | Discharge: 2023-06-21 | Disposition: A | Discharge status: Home / Self Care | Payer: Medicare Other | Source: Ambulatory Visit | Attending: Radiation Oncology | Admitting: Radiation Oncology

## 2023-06-21 DIAGNOSIS — Z510 Encounter for antineoplastic radiation therapy: Secondary | ICD-10-CM | POA: Diagnosis not present

## 2023-06-21 LAB — RAD ONC ARIA SESSION SUMMARY
Course Elapsed Days: 0
Plan Fractions Treated to Date: 1
Plan Prescribed Dose Per Fraction: 1.8 Gy
Plan Total Fractions Prescribed: 28
Plan Total Prescribed Dose: 50.4 Gy
Reference Point Dosage Given to Date: 1.8 Gy
Reference Point Session Dosage Given: 1.8 Gy
Session Number: 1

## 2023-06-22 ENCOUNTER — Other Ambulatory Visit: Payer: Self-pay

## 2023-06-22 ENCOUNTER — Ambulatory Visit
Admission: RE | Admit: 2023-06-22 | Discharge: 2023-06-22 | Disposition: A | Discharge status: Home / Self Care | Payer: Medicare Other | Source: Ambulatory Visit | Attending: Radiation Oncology | Admitting: Radiation Oncology

## 2023-06-22 DIAGNOSIS — Z510 Encounter for antineoplastic radiation therapy: Secondary | ICD-10-CM | POA: Diagnosis not present

## 2023-06-22 LAB — RAD ONC ARIA SESSION SUMMARY
Course Elapsed Days: 1
Plan Fractions Treated to Date: 2
Plan Prescribed Dose Per Fraction: 1.8 Gy
Plan Total Fractions Prescribed: 28
Plan Total Prescribed Dose: 50.4 Gy
Reference Point Dosage Given to Date: 3.6 Gy
Reference Point Session Dosage Given: 1.8 Gy
Session Number: 2

## 2023-06-23 ENCOUNTER — Other Ambulatory Visit: Payer: Self-pay

## 2023-06-23 ENCOUNTER — Ambulatory Visit
Admission: RE | Admit: 2023-06-23 | Discharge: 2023-06-23 | Disposition: A | Discharge status: Home / Self Care | Payer: Medicare Other | Source: Ambulatory Visit | Attending: Radiation Oncology | Admitting: Radiation Oncology

## 2023-06-23 DIAGNOSIS — Z510 Encounter for antineoplastic radiation therapy: Secondary | ICD-10-CM | POA: Diagnosis not present

## 2023-06-23 LAB — RAD ONC ARIA SESSION SUMMARY
Course Elapsed Days: 2
Plan Fractions Treated to Date: 3
Plan Prescribed Dose Per Fraction: 1.8 Gy
Plan Total Fractions Prescribed: 28
Plan Total Prescribed Dose: 50.4 Gy
Reference Point Dosage Given to Date: 5.4 Gy
Reference Point Session Dosage Given: 1.8 Gy
Session Number: 3

## 2023-06-24 ENCOUNTER — Other Ambulatory Visit: Payer: Self-pay

## 2023-06-24 ENCOUNTER — Ambulatory Visit
Admission: RE | Admit: 2023-06-24 | Discharge: 2023-06-24 | Disposition: A | Discharge status: Home / Self Care | Payer: Medicare Other | Source: Ambulatory Visit | Attending: Radiation Oncology | Admitting: Radiation Oncology

## 2023-06-24 DIAGNOSIS — Z510 Encounter for antineoplastic radiation therapy: Secondary | ICD-10-CM | POA: Diagnosis not present

## 2023-06-24 LAB — RAD ONC ARIA SESSION SUMMARY
Course Elapsed Days: 3
Plan Fractions Treated to Date: 4
Plan Prescribed Dose Per Fraction: 1.8 Gy
Plan Total Fractions Prescribed: 28
Plan Total Prescribed Dose: 50.4 Gy
Reference Point Dosage Given to Date: 7.2 Gy
Reference Point Session Dosage Given: 1.8 Gy
Session Number: 4

## 2023-06-27 ENCOUNTER — Ambulatory Visit
Admission: RE | Admit: 2023-06-27 | Discharge: 2023-06-27 | Disposition: A | Discharge status: Home / Self Care | Payer: Medicare Other | Source: Ambulatory Visit | Attending: Radiation Oncology | Admitting: Radiation Oncology

## 2023-06-27 ENCOUNTER — Inpatient Hospital Stay: Payer: Medicare Other

## 2023-06-27 ENCOUNTER — Other Ambulatory Visit: Payer: Self-pay

## 2023-06-27 DIAGNOSIS — M96843 Postprocedural seroma of a musculoskeletal structure following other procedure: Secondary | ICD-10-CM | POA: Insufficient documentation

## 2023-06-27 DIAGNOSIS — C50922 Malignant neoplasm of unspecified site of left male breast: Secondary | ICD-10-CM | POA: Diagnosis not present

## 2023-06-27 DIAGNOSIS — Z9012 Acquired absence of left breast and nipple: Secondary | ICD-10-CM | POA: Insufficient documentation

## 2023-06-27 DIAGNOSIS — C773 Secondary and unspecified malignant neoplasm of axilla and upper limb lymph nodes: Secondary | ICD-10-CM | POA: Diagnosis not present

## 2023-06-27 DIAGNOSIS — C50122 Malignant neoplasm of central portion of left male breast: Secondary | ICD-10-CM | POA: Insufficient documentation

## 2023-06-27 DIAGNOSIS — Z510 Encounter for antineoplastic radiation therapy: Secondary | ICD-10-CM | POA: Diagnosis present

## 2023-06-27 DIAGNOSIS — Z170 Estrogen receptor positive status [ER+]: Secondary | ICD-10-CM | POA: Insufficient documentation

## 2023-06-27 LAB — CBC (CANCER CENTER ONLY)
HCT: 46.5 % (ref 39.0–52.0)
Hemoglobin: 15.1 g/dL (ref 13.0–17.0)
MCH: 30.2 pg (ref 26.0–34.0)
MCHC: 32.5 g/dL (ref 30.0–36.0)
MCV: 93 fL (ref 80.0–100.0)
Platelet Count: 165 10*3/uL (ref 150–400)
RBC: 5 MIL/uL (ref 4.22–5.81)
RDW: 12.5 % (ref 11.5–15.5)
WBC Count: 9.3 10*3/uL (ref 4.0–10.5)
nRBC: 0 % (ref 0.0–0.2)

## 2023-06-27 LAB — RAD ONC ARIA SESSION SUMMARY
Course Elapsed Days: 6
Plan Fractions Treated to Date: 5
Plan Prescribed Dose Per Fraction: 1.8 Gy
Plan Total Fractions Prescribed: 28
Plan Total Prescribed Dose: 50.4 Gy
Reference Point Dosage Given to Date: 9 Gy
Reference Point Session Dosage Given: 1.8 Gy
Session Number: 5

## 2023-06-28 ENCOUNTER — Other Ambulatory Visit: Payer: Self-pay

## 2023-06-28 ENCOUNTER — Ambulatory Visit
Admission: RE | Admit: 2023-06-28 | Discharge: 2023-06-28 | Disposition: A | Discharge status: Home / Self Care | Payer: Medicare Other | Source: Ambulatory Visit | Attending: Radiation Oncology | Admitting: Radiation Oncology

## 2023-06-28 DIAGNOSIS — Z510 Encounter for antineoplastic radiation therapy: Secondary | ICD-10-CM | POA: Diagnosis not present

## 2023-06-28 LAB — RAD ONC ARIA SESSION SUMMARY
Course Elapsed Days: 7
Plan Fractions Treated to Date: 6
Plan Prescribed Dose Per Fraction: 1.8 Gy
Plan Total Fractions Prescribed: 28
Plan Total Prescribed Dose: 50.4 Gy
Reference Point Dosage Given to Date: 10.8 Gy
Reference Point Session Dosage Given: 1.8 Gy
Session Number: 6

## 2023-06-29 ENCOUNTER — Other Ambulatory Visit: Payer: Self-pay

## 2023-06-29 ENCOUNTER — Ambulatory Visit
Admission: RE | Admit: 2023-06-29 | Discharge: 2023-06-29 | Disposition: A | Discharge status: Home / Self Care | Payer: Medicare Other | Source: Ambulatory Visit | Attending: Radiation Oncology | Admitting: Radiation Oncology

## 2023-06-29 DIAGNOSIS — Z510 Encounter for antineoplastic radiation therapy: Secondary | ICD-10-CM | POA: Diagnosis not present

## 2023-06-29 LAB — RAD ONC ARIA SESSION SUMMARY
Course Elapsed Days: 8
Plan Fractions Treated to Date: 7
Plan Prescribed Dose Per Fraction: 1.8 Gy
Plan Total Fractions Prescribed: 28
Plan Total Prescribed Dose: 50.4 Gy
Reference Point Dosage Given to Date: 12.6 Gy
Reference Point Session Dosage Given: 1.8 Gy
Session Number: 7

## 2023-07-01 ENCOUNTER — Other Ambulatory Visit: Payer: Self-pay

## 2023-07-01 ENCOUNTER — Ambulatory Visit
Admission: RE | Admit: 2023-07-01 | Discharge: 2023-07-01 | Disposition: A | Discharge status: Home / Self Care | Payer: Medicare Other | Source: Ambulatory Visit | Attending: Radiation Oncology | Admitting: Radiation Oncology

## 2023-07-01 DIAGNOSIS — Z510 Encounter for antineoplastic radiation therapy: Secondary | ICD-10-CM | POA: Diagnosis not present

## 2023-07-01 LAB — RAD ONC ARIA SESSION SUMMARY
Course Elapsed Days: 10
Plan Fractions Treated to Date: 8
Plan Prescribed Dose Per Fraction: 1.8 Gy
Plan Total Fractions Prescribed: 28
Plan Total Prescribed Dose: 50.4 Gy
Reference Point Dosage Given to Date: 14.4 Gy
Reference Point Session Dosage Given: 1.8 Gy
Session Number: 8

## 2023-07-04 ENCOUNTER — Ambulatory Visit
Admission: RE | Admit: 2023-07-04 | Discharge: 2023-07-04 | Disposition: A | Discharge status: Home / Self Care | Payer: Medicare Other | Source: Ambulatory Visit | Attending: Radiation Oncology | Admitting: Radiation Oncology

## 2023-07-04 ENCOUNTER — Other Ambulatory Visit: Payer: Self-pay

## 2023-07-04 DIAGNOSIS — Z510 Encounter for antineoplastic radiation therapy: Secondary | ICD-10-CM | POA: Diagnosis not present

## 2023-07-04 LAB — RAD ONC ARIA SESSION SUMMARY
Course Elapsed Days: 13
Plan Fractions Treated to Date: 9
Plan Prescribed Dose Per Fraction: 1.8 Gy
Plan Total Fractions Prescribed: 28
Plan Total Prescribed Dose: 50.4 Gy
Reference Point Dosage Given to Date: 16.2 Gy
Reference Point Session Dosage Given: 1.8 Gy
Session Number: 9

## 2023-07-05 ENCOUNTER — Ambulatory Visit
Admission: RE | Admit: 2023-07-05 | Discharge: 2023-07-05 | Disposition: A | Discharge status: Home / Self Care | Payer: Medicare Other | Source: Ambulatory Visit | Attending: Radiation Oncology | Admitting: Radiation Oncology

## 2023-07-05 ENCOUNTER — Other Ambulatory Visit: Payer: Self-pay

## 2023-07-05 DIAGNOSIS — Z510 Encounter for antineoplastic radiation therapy: Secondary | ICD-10-CM | POA: Diagnosis not present

## 2023-07-05 LAB — RAD ONC ARIA SESSION SUMMARY
Course Elapsed Days: 14
Plan Fractions Treated to Date: 10
Plan Prescribed Dose Per Fraction: 1.8 Gy
Plan Total Fractions Prescribed: 28
Plan Total Prescribed Dose: 50.4 Gy
Reference Point Dosage Given to Date: 18 Gy
Reference Point Session Dosage Given: 1.8 Gy
Session Number: 10

## 2023-07-06 ENCOUNTER — Other Ambulatory Visit: Payer: Self-pay

## 2023-07-06 ENCOUNTER — Ambulatory Visit
Admission: RE | Admit: 2023-07-06 | Discharge: 2023-07-06 | Disposition: A | Discharge status: Home / Self Care | Payer: Medicare Other | Source: Ambulatory Visit | Attending: Radiation Oncology | Admitting: Radiation Oncology

## 2023-07-06 DIAGNOSIS — Z510 Encounter for antineoplastic radiation therapy: Secondary | ICD-10-CM | POA: Diagnosis not present

## 2023-07-06 LAB — RAD ONC ARIA SESSION SUMMARY
Course Elapsed Days: 15
Plan Fractions Treated to Date: 11
Plan Prescribed Dose Per Fraction: 1.8 Gy
Plan Total Fractions Prescribed: 28
Plan Total Prescribed Dose: 50.4 Gy
Reference Point Dosage Given to Date: 19.8 Gy
Reference Point Session Dosage Given: 1.8 Gy
Session Number: 11

## 2023-07-07 ENCOUNTER — Ambulatory Visit
Admission: RE | Admit: 2023-07-07 | Discharge: 2023-07-07 | Disposition: A | Discharge status: Home / Self Care | Payer: Medicare Other | Source: Ambulatory Visit | Attending: Radiation Oncology | Admitting: Radiation Oncology

## 2023-07-07 ENCOUNTER — Other Ambulatory Visit: Payer: Self-pay

## 2023-07-07 DIAGNOSIS — Z510 Encounter for antineoplastic radiation therapy: Secondary | ICD-10-CM | POA: Diagnosis not present

## 2023-07-07 LAB — RAD ONC ARIA SESSION SUMMARY
Course Elapsed Days: 16
Plan Fractions Treated to Date: 12
Plan Prescribed Dose Per Fraction: 1.8 Gy
Plan Total Fractions Prescribed: 28
Plan Total Prescribed Dose: 50.4 Gy
Reference Point Dosage Given to Date: 21.6 Gy
Reference Point Session Dosage Given: 1.8 Gy
Session Number: 12

## 2023-07-08 ENCOUNTER — Other Ambulatory Visit: Payer: Self-pay

## 2023-07-08 ENCOUNTER — Ambulatory Visit
Admission: RE | Admit: 2023-07-08 | Discharge: 2023-07-08 | Disposition: A | Discharge status: Home / Self Care | Payer: Medicare Other | Source: Ambulatory Visit | Attending: Radiation Oncology | Admitting: Radiation Oncology

## 2023-07-08 DIAGNOSIS — Z510 Encounter for antineoplastic radiation therapy: Secondary | ICD-10-CM | POA: Diagnosis not present

## 2023-07-08 LAB — RAD ONC ARIA SESSION SUMMARY
Course Elapsed Days: 17
Plan Fractions Treated to Date: 13
Plan Prescribed Dose Per Fraction: 1.8 Gy
Plan Total Fractions Prescribed: 28
Plan Total Prescribed Dose: 50.4 Gy
Reference Point Dosage Given to Date: 23.4 Gy
Reference Point Session Dosage Given: 1.8 Gy
Session Number: 13

## 2023-07-11 ENCOUNTER — Other Ambulatory Visit: Payer: Self-pay

## 2023-07-11 ENCOUNTER — Inpatient Hospital Stay: Payer: Medicare Other

## 2023-07-11 ENCOUNTER — Ambulatory Visit: Admission: RE | Admit: 2023-07-11 | Payer: Medicare Other | Source: Ambulatory Visit

## 2023-07-11 DIAGNOSIS — C50922 Malignant neoplasm of unspecified site of left male breast: Secondary | ICD-10-CM

## 2023-07-11 DIAGNOSIS — Z510 Encounter for antineoplastic radiation therapy: Secondary | ICD-10-CM | POA: Diagnosis not present

## 2023-07-11 LAB — CBC (CANCER CENTER ONLY)
HCT: 46.9 % (ref 39.0–52.0)
Hemoglobin: 15.5 g/dL (ref 13.0–17.0)
MCH: 30.8 pg (ref 26.0–34.0)
MCHC: 33 g/dL (ref 30.0–36.0)
MCV: 93.2 fL (ref 80.0–100.0)
Platelet Count: 148 10*3/uL — ABNORMAL LOW (ref 150–400)
RBC: 5.03 MIL/uL (ref 4.22–5.81)
RDW: 12.7 % (ref 11.5–15.5)
WBC Count: 9.1 10*3/uL (ref 4.0–10.5)
nRBC: 0 % (ref 0.0–0.2)

## 2023-07-11 LAB — RAD ONC ARIA SESSION SUMMARY
Course Elapsed Days: 20
Plan Fractions Treated to Date: 14
Plan Prescribed Dose Per Fraction: 1.8 Gy
Plan Total Fractions Prescribed: 28
Plan Total Prescribed Dose: 50.4 Gy
Reference Point Dosage Given to Date: 25.2 Gy
Reference Point Session Dosage Given: 1.8 Gy
Session Number: 14

## 2023-07-12 ENCOUNTER — Ambulatory Visit
Admission: RE | Admit: 2023-07-12 | Discharge: 2023-07-12 | Disposition: A | Discharge status: Home / Self Care | Payer: Medicare Other | Source: Ambulatory Visit | Attending: Radiation Oncology | Admitting: Radiation Oncology

## 2023-07-12 ENCOUNTER — Other Ambulatory Visit: Payer: Self-pay

## 2023-07-12 DIAGNOSIS — Z510 Encounter for antineoplastic radiation therapy: Secondary | ICD-10-CM | POA: Diagnosis not present

## 2023-07-12 LAB — RAD ONC ARIA SESSION SUMMARY
Course Elapsed Days: 21
Plan Fractions Treated to Date: 15
Plan Prescribed Dose Per Fraction: 1.8 Gy
Plan Total Fractions Prescribed: 28
Plan Total Prescribed Dose: 50.4 Gy
Reference Point Dosage Given to Date: 27 Gy
Reference Point Session Dosage Given: 1.8 Gy
Session Number: 15

## 2023-07-13 ENCOUNTER — Ambulatory Visit
Admission: RE | Admit: 2023-07-13 | Discharge: 2023-07-13 | Disposition: A | Discharge status: Home / Self Care | Payer: Medicare Other | Source: Ambulatory Visit | Attending: Radiation Oncology | Admitting: Radiation Oncology

## 2023-07-13 ENCOUNTER — Other Ambulatory Visit: Payer: Self-pay

## 2023-07-13 DIAGNOSIS — Z510 Encounter for antineoplastic radiation therapy: Secondary | ICD-10-CM | POA: Diagnosis not present

## 2023-07-13 LAB — RAD ONC ARIA SESSION SUMMARY
Course Elapsed Days: 22
Plan Fractions Treated to Date: 16
Plan Prescribed Dose Per Fraction: 1.8 Gy
Plan Total Fractions Prescribed: 28
Plan Total Prescribed Dose: 50.4 Gy
Reference Point Dosage Given to Date: 28.8 Gy
Reference Point Session Dosage Given: 1.8 Gy
Session Number: 16

## 2023-07-14 ENCOUNTER — Ambulatory Visit
Admission: RE | Admit: 2023-07-14 | Discharge: 2023-07-14 | Disposition: A | Discharge status: Home / Self Care | Payer: Medicare Other | Source: Ambulatory Visit | Attending: Radiation Oncology | Admitting: Radiation Oncology

## 2023-07-14 ENCOUNTER — Other Ambulatory Visit: Payer: Self-pay

## 2023-07-14 DIAGNOSIS — Z510 Encounter for antineoplastic radiation therapy: Secondary | ICD-10-CM | POA: Diagnosis not present

## 2023-07-14 LAB — RAD ONC ARIA SESSION SUMMARY
Course Elapsed Days: 23
Plan Fractions Treated to Date: 17
Plan Prescribed Dose Per Fraction: 1.8 Gy
Plan Total Fractions Prescribed: 28
Plan Total Prescribed Dose: 50.4 Gy
Reference Point Dosage Given to Date: 30.6 Gy
Reference Point Session Dosage Given: 1.8 Gy
Session Number: 17

## 2023-07-15 ENCOUNTER — Ambulatory Visit: Payer: Medicare Other

## 2023-07-18 ENCOUNTER — Other Ambulatory Visit: Payer: Self-pay

## 2023-07-18 ENCOUNTER — Ambulatory Visit: Admission: RE | Admit: 2023-07-18 | Payer: Medicare Other | Source: Ambulatory Visit

## 2023-07-18 DIAGNOSIS — Z510 Encounter for antineoplastic radiation therapy: Secondary | ICD-10-CM | POA: Diagnosis not present

## 2023-07-18 LAB — RAD ONC ARIA SESSION SUMMARY
Course Elapsed Days: 27
Plan Fractions Treated to Date: 18
Plan Prescribed Dose Per Fraction: 1.8 Gy
Plan Total Fractions Prescribed: 28
Plan Total Prescribed Dose: 50.4 Gy
Reference Point Dosage Given to Date: 32.4 Gy
Reference Point Session Dosage Given: 1.8 Gy
Session Number: 18

## 2023-07-19 ENCOUNTER — Other Ambulatory Visit: Payer: Self-pay

## 2023-07-19 ENCOUNTER — Ambulatory Visit
Admission: RE | Admit: 2023-07-19 | Discharge: 2023-07-19 | Disposition: A | Discharge status: Home / Self Care | Payer: Medicare Other | Source: Ambulatory Visit | Attending: Radiation Oncology | Admitting: Radiation Oncology

## 2023-07-19 DIAGNOSIS — Z510 Encounter for antineoplastic radiation therapy: Secondary | ICD-10-CM | POA: Diagnosis not present

## 2023-07-19 LAB — RAD ONC ARIA SESSION SUMMARY
Course Elapsed Days: 28
Plan Fractions Treated to Date: 1
Plan Prescribed Dose Per Fraction: 1.8 Gy
Plan Total Fractions Prescribed: 10
Plan Total Prescribed Dose: 18 Gy
Reference Point Dosage Given to Date: 34.2 Gy
Reference Point Session Dosage Given: 1.8 Gy
Session Number: 19

## 2023-07-20 ENCOUNTER — Other Ambulatory Visit: Payer: Self-pay

## 2023-07-20 ENCOUNTER — Ambulatory Visit: Payer: Medicare Other

## 2023-07-20 ENCOUNTER — Inpatient Hospital Stay: Payer: Medicare Other | Admitting: Occupational Therapy

## 2023-07-20 ENCOUNTER — Ambulatory Visit: Admission: RE | Admit: 2023-07-20 | Payer: Medicare Other | Source: Ambulatory Visit

## 2023-07-20 DIAGNOSIS — M25612 Stiffness of left shoulder, not elsewhere classified: Secondary | ICD-10-CM

## 2023-07-20 DIAGNOSIS — Z510 Encounter for antineoplastic radiation therapy: Secondary | ICD-10-CM | POA: Diagnosis not present

## 2023-07-20 DIAGNOSIS — Z9012 Acquired absence of left breast and nipple: Secondary | ICD-10-CM

## 2023-07-20 LAB — RAD ONC ARIA SESSION SUMMARY
Course Elapsed Days: 29
Plan Fractions Treated to Date: 2
Plan Prescribed Dose Per Fraction: 1.8 Gy
Plan Total Fractions Prescribed: 10
Plan Total Prescribed Dose: 18 Gy
Reference Point Dosage Given to Date: 36 Gy
Reference Point Session Dosage Given: 1.8 Gy
Session Number: 20

## 2023-07-20 NOTE — Therapy (Signed)
[facility] at [facility] [address] [place], [address] [place], [place], [address] Phone: [phone]   Fax:  [phone]  Occupational Therapy Screen:  Patient Details  Name: Juan Calhoun MRN: [id number] Date of Birth: [birth year] No data recorded  Encounter Date: 07/20/2023   OT End of Session - 07/20/23 1053     Visit Number 0             Past Medical History:  Diagnosis Date   Cataract cortical, senile    Chronic back pain    CKD (chronic kidney disease) stage 3, GFR 30-59 ml/min (HCC)    Diabetes mellitus without complication (HCC)    External hemorrhoids    Hypercholesterolemia    Hyperlipidemia    Hyperplastic colon polyp    Hypertension    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   Osteoarthritis     Past Surgical History:  Procedure Laterality Date   APPENDECTOMY     AXILLARY LYMPH NODE DISSECTION Left 05/05/2023   Procedure: AXILLARY LYMPH NODE DISSECTION;  Surgeon: Sung Amabile, DO;  Location: [facility];  Service: General;  Laterality: Left;   BREAST BIOPSY Left 04/01/2023   u/s bx, COIL clip- [facility]   BREAST BIOPSY Left 03/31/2023   u/s bx axilla-HYDROMARK(coil) METASTATIC   BREAST BIOPSY Left 04/01/2023   [place] LT BREAST BX W LOC DEV 1ST LESION IMG BX SPEC US GUIDE 04/01/2023 [facility]   BREAST BIOPSY Right 04/20/2023   (NOT DONE)    [place] RT BREAST BX W LOC DEV 1ST LESION IMG BX SPEC US GUIDE 04/20/2023 [facility]   BREAST BIOPSY Left 05/03/2023   [place] LT RADIO FREQUENCY TAG LOC US GUIDE 05/03/2023 [facility]   CATARACT EXTRACTION Bilateral    CHOLECYSTECTOMY     COLONOSCOPY  01/27/2021   COLONOSCOPY WITH PROPOFOL N/A 09/19/2015   Procedure: COLONOSCOPY WITH PROPOFOL;  Surgeon: Elnita Maxwell, MD;  Location: [facility];  Service: Endoscopy;  Laterality: N/A;   TOTAL MASTECTOMY Left 05/05/2023   Procedure: TOTAL MASTECTOMY w/ RF tag breast and axillary;  Surgeon: Sung Amabile, DO;   Location: [facility];  Service: General;  Laterality: Left;    There were no vitals filed for this visit.   Subjective Assessment - 07/20/23 1052     Subjective  I am just over 3 1/2 wks with radiation but I had this increase swelling in my chest - and they had to adjust the machine -few times- but then yesterday I did see the Surgeon and he pulled a lot of fluid off    Currently in Pain? 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 Now with seroma. Proceed with bedside US guided drainage as noted below:  Pre-op: post op seroma Post op: same Procedure: needle aspiration of seroma using US guidance  EBL: minimal: Complication: none apparent Description of procedure: after obtaining verbal consent, area sterilized in usual fashion. [place] bedside confirmed homogenous hypoechoic area consistent with seroma. Skin anesthesized and 18gauze needle placed in lateral aspect of seroma. Immediate drainage of thin, straw colored fluid noted and drained, approximately . [place] again used at end to ensure majority of fluid drained, scant area noted on [place] was reached with needle under direct visualization but no  further fluid able to be aspirated and drained. Needle removed and area covered with 4x4 gauze and tape. Pt tolerated procedure well.  F/u prn if recurrence, which is at high risk due to size of initial seroma. Encouraged using ACE wrap to keep pressure around area. All questions addressed   LYMPHEDEMA/ONCOLOGY QUESTIONNAIRE - 07/20/23 0001       Right Upper Extremity Lymphedema   15 cm Proximal to Olecranon Process 30.8 cm    10 cm Proximal to Olecranon Process 29 cm    Olecranon Process 26.5 cm      Left Upper Extremity Lymphedema   15 cm Proximal to Olecranon Process 29.5 cm    10 cm Proximal  to Olecranon Process 27.5 cm    Olecranon Process 26.5 cm             OT SCREEN 07/20/23: Pt follow up with OT about 1/2 ways thru radiation . AROM  in L shoulder WNL - some tightness in chest - but had seroma drained yesterday Remind pt again to cont with doing AAROM for shoulder flexion and ABD in shower in the am to maintain his flexibility thru out radiation. 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 [place], [place], [address] Phone: [phone]   Fax:  [phone]  Name: Juan Calhoun MRN: [id number] Date of Birth: 1941/01/11

## 2023-07-21 ENCOUNTER — Other Ambulatory Visit: Payer: Self-pay

## 2023-07-21 ENCOUNTER — Ambulatory Visit
Admission: RE | Admit: 2023-07-21 | Discharge: 2023-07-21 | Disposition: A | Discharge status: Home / Self Care | Payer: Medicare Other | Source: Ambulatory Visit | Attending: Radiation Oncology | Admitting: Radiation Oncology

## 2023-07-21 ENCOUNTER — Ambulatory Visit: Payer: Medicare Other

## 2023-07-21 DIAGNOSIS — Z510 Encounter for antineoplastic radiation therapy: Secondary | ICD-10-CM | POA: Diagnosis not present

## 2023-07-21 LAB — RAD ONC ARIA SESSION SUMMARY
Course Elapsed Days: 30
Plan Fractions Treated to Date: 3
Plan Prescribed Dose Per Fraction: 1.8 Gy
Plan Total Fractions Prescribed: 10
Plan Total Prescribed Dose: 18 Gy
Reference Point Dosage Given to Date: 37.8 Gy
Reference Point Session Dosage Given: 1.8 Gy
Session Number: 21

## 2023-07-22 ENCOUNTER — Other Ambulatory Visit: Payer: Self-pay

## 2023-07-22 ENCOUNTER — Ambulatory Visit
Admission: RE | Admit: 2023-07-22 | Discharge: 2023-07-22 | Disposition: A | Discharge status: Home / Self Care | Payer: Medicare Other | Source: Ambulatory Visit | Attending: Radiation Oncology | Admitting: Radiation Oncology

## 2023-07-22 DIAGNOSIS — Z510 Encounter for antineoplastic radiation therapy: Secondary | ICD-10-CM | POA: Diagnosis not present

## 2023-07-22 LAB — RAD ONC ARIA SESSION SUMMARY
Course Elapsed Days: 31
Plan Fractions Treated to Date: 4
Plan Prescribed Dose Per Fraction: 1.8 Gy
Plan Total Fractions Prescribed: 10
Plan Total Prescribed Dose: 18 Gy
Reference Point Dosage Given to Date: 39.6 Gy
Reference Point Session Dosage Given: 1.8 Gy
Session Number: 22

## 2023-07-25 ENCOUNTER — Other Ambulatory Visit: Payer: Self-pay

## 2023-07-25 ENCOUNTER — Ambulatory Visit
Admission: RE | Admit: 2023-07-25 | Discharge: 2023-07-25 | Disposition: A | Discharge status: Home / Self Care | Payer: Medicare Other | Source: Ambulatory Visit | Attending: Radiation Oncology | Admitting: Radiation Oncology

## 2023-07-25 ENCOUNTER — Inpatient Hospital Stay: Payer: Medicare Other

## 2023-07-25 DIAGNOSIS — Z510 Encounter for antineoplastic radiation therapy: Secondary | ICD-10-CM | POA: Diagnosis not present

## 2023-07-25 DIAGNOSIS — C50922 Malignant neoplasm of unspecified site of left male breast: Secondary | ICD-10-CM

## 2023-07-25 LAB — CBC (CANCER CENTER ONLY)
HCT: 46.9 % (ref 39.0–52.0)
Hemoglobin: 15.1 g/dL (ref 13.0–17.0)
MCH: 30.3 pg (ref 26.0–34.0)
MCHC: 32.2 g/dL (ref 30.0–36.0)
MCV: 94.2 fL (ref 80.0–100.0)
Platelet Count: 127 10*3/uL — ABNORMAL LOW (ref 150–400)
RBC: 4.98 MIL/uL (ref 4.22–5.81)
RDW: 12.9 % (ref 11.5–15.5)
WBC Count: 8.2 10*3/uL (ref 4.0–10.5)
nRBC: 0 % (ref 0.0–0.2)

## 2023-07-25 LAB — RAD ONC ARIA SESSION SUMMARY
Course Elapsed Days: 34
Plan Fractions Treated to Date: 5
Plan Prescribed Dose Per Fraction: 1.8 Gy
Plan Total Fractions Prescribed: 10
Plan Total Prescribed Dose: 18 Gy
Reference Point Dosage Given to Date: 41.4 Gy
Reference Point Session Dosage Given: 1.8 Gy
Session Number: 23

## 2023-07-26 ENCOUNTER — Ambulatory Visit
Admission: RE | Admit: 2023-07-26 | Discharge: 2023-07-26 | Disposition: A | Discharge status: Home / Self Care | Payer: Medicare Other | Source: Ambulatory Visit | Attending: Radiation Oncology | Admitting: Radiation Oncology

## 2023-07-26 ENCOUNTER — Other Ambulatory Visit: Payer: Self-pay

## 2023-07-26 ENCOUNTER — Ambulatory Visit: Payer: Medicare Other

## 2023-07-26 DIAGNOSIS — Z510 Encounter for antineoplastic radiation therapy: Secondary | ICD-10-CM | POA: Diagnosis not present

## 2023-07-26 LAB — RAD ONC ARIA SESSION SUMMARY
Course Elapsed Days: 35
Plan Fractions Treated to Date: 6
Plan Prescribed Dose Per Fraction: 1.8 Gy
Plan Total Fractions Prescribed: 10
Plan Total Prescribed Dose: 18 Gy
Reference Point Dosage Given to Date: 43.2 Gy
Reference Point Session Dosage Given: 1.8 Gy
Session Number: 24

## 2023-07-27 ENCOUNTER — Other Ambulatory Visit: Payer: Self-pay

## 2023-07-27 ENCOUNTER — Ambulatory Visit
Admission: RE | Admit: 2023-07-27 | Discharge: 2023-07-27 | Disposition: A | Discharge status: Home / Self Care | Payer: Medicare Other | Source: Ambulatory Visit | Attending: Radiation Oncology | Admitting: Radiation Oncology

## 2023-07-27 DIAGNOSIS — Z510 Encounter for antineoplastic radiation therapy: Secondary | ICD-10-CM | POA: Diagnosis not present

## 2023-07-27 LAB — RAD ONC ARIA SESSION SUMMARY
Course Elapsed Days: 36
Plan Fractions Treated to Date: 7
Plan Prescribed Dose Per Fraction: 1.8 Gy
Plan Total Fractions Prescribed: 10
Plan Total Prescribed Dose: 18 Gy
Reference Point Dosage Given to Date: 45 Gy
Reference Point Session Dosage Given: 1.8 Gy
Session Number: 25

## 2023-07-28 ENCOUNTER — Ambulatory Visit
Admission: RE | Admit: 2023-07-28 | Discharge: 2023-07-28 | Disposition: A | Discharge status: Home / Self Care | Payer: Medicare Other | Source: Ambulatory Visit | Attending: Radiation Oncology | Admitting: Radiation Oncology

## 2023-07-28 ENCOUNTER — Other Ambulatory Visit: Payer: Self-pay

## 2023-07-28 DIAGNOSIS — Z510 Encounter for antineoplastic radiation therapy: Secondary | ICD-10-CM | POA: Insufficient documentation

## 2023-07-28 DIAGNOSIS — C773 Secondary and unspecified malignant neoplasm of axilla and upper limb lymph nodes: Secondary | ICD-10-CM | POA: Diagnosis not present

## 2023-07-28 DIAGNOSIS — C50922 Malignant neoplasm of unspecified site of left male breast: Secondary | ICD-10-CM | POA: Insufficient documentation

## 2023-07-28 DIAGNOSIS — Z170 Estrogen receptor positive status [ER+]: Secondary | ICD-10-CM | POA: Diagnosis not present

## 2023-07-28 DIAGNOSIS — C50122 Malignant neoplasm of central portion of left male breast: Secondary | ICD-10-CM | POA: Insufficient documentation

## 2023-07-28 LAB — RAD ONC ARIA SESSION SUMMARY
Course Elapsed Days: 37
Plan Fractions Treated to Date: 8
Plan Prescribed Dose Per Fraction: 1.8 Gy
Plan Total Fractions Prescribed: 10
Plan Total Prescribed Dose: 18 Gy
Reference Point Dosage Given to Date: 46.8 Gy
Reference Point Session Dosage Given: 1.8 Gy
Session Number: 26

## 2023-07-29 ENCOUNTER — Inpatient Hospital Stay: Payer: Medicare Other | Attending: Oncology | Admitting: Oncology

## 2023-07-29 ENCOUNTER — Other Ambulatory Visit: Payer: Self-pay

## 2023-07-29 ENCOUNTER — Ambulatory Visit
Admission: RE | Admit: 2023-07-29 | Discharge: 2023-07-29 | Disposition: A | Discharge status: Home / Self Care | Payer: Medicare Other | Source: Ambulatory Visit | Attending: Radiation Oncology | Admitting: Radiation Oncology

## 2023-07-29 ENCOUNTER — Encounter: Payer: Self-pay | Admitting: Oncology

## 2023-07-29 ENCOUNTER — Ambulatory Visit: Payer: Medicare Other

## 2023-07-29 VITALS — BP 100/68 | HR 61 | Temp 95.9°F | Resp 18 | Wt 205.0 lb

## 2023-07-29 DIAGNOSIS — C50922 Malignant neoplasm of unspecified site of left male breast: Secondary | ICD-10-CM | POA: Diagnosis not present

## 2023-07-29 DIAGNOSIS — Z510 Encounter for antineoplastic radiation therapy: Secondary | ICD-10-CM | POA: Diagnosis not present

## 2023-07-29 LAB — RAD ONC ARIA SESSION SUMMARY
Course Elapsed Days: 38
Plan Fractions Treated to Date: 9
Plan Prescribed Dose Per Fraction: 1.8 Gy
Plan Total Fractions Prescribed: 10
Plan Total Prescribed Dose: 18 Gy
Reference Point Dosage Given to Date: 48.6 Gy
Reference Point Session Dosage Given: 1.8 Gy
Session Number: 27

## 2023-07-29 MED ORDER — TAMOXIFEN CITRATE 20 MG PO TABS: 20.0000 mg | ORAL_TABLET | Freq: Every day | ORAL | 30 refills | Status: DC

## 2023-07-29 NOTE — Progress Notes (Signed)
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  INTERVAL HISTORY: Patient returns to clinic today for further further evaluation and discussion of initiation of tamoxifen.  He is currently getting XRT and tolerating treatment well.  He will complete on August 08, 2023.  He currently feels well and is asymptomatic.  He has no neurologic complaints.  He denies any recent fevers or illnesses.  He has a good appetite and denies weight loss.  He has no chest pain, shortness of breath, cough, or hemoptysis.  He denies any nausea, vomiting, constipation, or diarrhea.  He has no urinary complaints.  Patient offers no specific complaints today.  REVIEW OF SYSTEMS:   Review of Systems  Constitutional: Negative.  Negative for fever, malaise/fatigue and weight loss.  Respiratory: Negative.  Negative for cough, hemoptysis and shortness of breath.   Cardiovascular: Negative.  Negative for chest pain and leg swelling.  Gastrointestinal: Negative.  Negative for abdominal pain.  Genitourinary: Negative.  Negative for dysuria.  Musculoskeletal: Negative.  Negative for back pain.  Skin: Negative.  Negative for rash.  Neurological: Negative.  Negative for dizziness, focal weakness, weakness and headaches.  Psychiatric/Behavioral: Negative.  The patient is not nervous/anxious.     As per HPI. Otherwise, a complete review of systems is negative.  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   Osteoarthritis     PAST SURGICAL HISTORY: Past Surgical History:  Procedure Laterality Date   APPENDECTOMY     AXILLARY LYMPH NODE DISSECTION Left 05/05/2023   Procedure: AXILLARY LYMPH NODE DISSECTION;  Surgeon: Sung Amabile, DO;  Location: [facility];  Service: General;  Laterality: Left;   BREAST BIOPSY Left 04/01/2023   u/s bx, COIL clip- [facility]   BREAST BIOPSY Left 03/31/2023   u/s bx axilla-HYDROMARK(coil) METASTATIC   BREAST BIOPSY Left 04/01/2023   [place] LT BREAST BX W LOC DEV 1ST LESION IMG BX SPEC US GUIDE 04/01/2023 [facility]   BREAST BIOPSY Right 04/20/2023   (NOT DONE)    [place] RT BREAST BX W LOC DEV 1ST LESION IMG BX SPEC US GUIDE 04/20/2023 [facility]   BREAST BIOPSY Left 05/03/2023   [place] LT RADIO FREQUENCY TAG LOC US GUIDE 05/03/2023 [facility]   CATARACT EXTRACTION Bilateral    CHOLECYSTECTOMY     COLONOSCOPY  01/27/2021   COLONOSCOPY WITH PROPOFOL N/A 09/19/2015   Procedure: COLONOSCOPY WITH PROPOFOL;  Surgeon: Elnita Maxwell, MD;  Location: [facility];  Service: Endoscopy;  Laterality: N/A;   TOTAL MASTECTOMY Left 05/05/2023   Procedure: TOTAL MASTECTOMY w/ RF tag breast and axillary;  Surgeon: Sung Amabile, DO;  Location: [facility];  Service: General;  Laterality: Left;    FAMILY HISTORY: Family History  Problem Relation Age of Onset   Breast cancer Mother    Heart disease Father  Heart attack Brother    Heart disease Maternal Uncle     ADVANCED DIRECTIVES (Y/N):  N  HEALTH MAINTENANCE: Social History   Tobacco Use   Smoking status: Former    Current packs/day: 0.00    Types: Cigarettes    Quit date: 1986    Years since quitting: 38.6   Smokeless tobacco: Never  Vaping Use   Vaping status: Never Used  Substance Use Topics   Alcohol use: No   Drug use: No      Colonoscopy:  PAP:  Bone density:  Lipid panel:  No Known Allergies  Current Outpatient Medications  Medication Sig Dispense Refill   atorvastatin (LIPITOR) 40 MG tablet Take 40 mg by mouth daily.     cyanocobalamin (VITAMIN B12) 1000 MCG tablet Take 1,000 mcg by mouth daily.     ketoconazole (NIZORAL) 2 % shampoo Apply 1 Application topically as needed.     triamcinolone ointment (KENALOG) 0.1 % Apply 1 Application topically 2 (two) times daily as needed.     TRULICITY 3 MG/0.5ML SOPN Inject 3 mg into the skin once a week. Sunday     HYDROcodone-acetaminophen (NORCO) 5-325 MG tablet Take 1 tablet by mouth every 6 (six) hours as needed for up to 6 doses for moderate pain. 6 tablet 0   losartan (COZAAR) 50 MG tablet Take 50 mg by mouth daily.     No current facility-administered medications for this visit.    OBJECTIVE: Vitals:   07/29/23 1027  BP: 100/68  Pulse: 61  Resp: 18  Temp: (!) 95.9 F (35.5 C)  SpO2: 100%     Body mass index is 27.8 kg/m.    ECOG FS:0 - Asymptomatic  General: Well-developed, well-nourished, no acute distress. Eyes: Pink conjunctiva, anicteric sclera. HEENT: Normocephalic, moist mucous membranes. Lungs: No audible wheezing or coughing. Heart: Regular rate and rhythm. Abdomen: Soft, nontender, no obvious distention. Musculoskeletal: No edema, cyanosis, or clubbing. Neuro: Alert, answering all questions appropriately. Cranial nerves grossly intact. Skin: No rashes or petechiae noted. Psych: Normal affect.  LAB RESULTS:  Lab Results  Component Value Date   NA 137 05/06/2023   K 4.2 05/06/2023   CL 110 05/06/2023   CO2 21 (L) 05/06/2023   GLUCOSE 174 (H) 05/06/2023   BUN 20 05/06/2023   CREATININE 1.19 05/06/2023   CALCIUM 8.4 (L) 05/06/2023   GFRNONAA >60 05/06/2023    Lab Results  Component Value Date   WBC 8.2 07/25/2023   HGB 15.1 07/25/2023   HCT 46.9 07/25/2023   MCV 94.2 07/25/2023   PLT 127 (L) 07/25/2023      STUDIES: No results found.  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  Oncotype score 4.  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  He has noted to have 2 axillary lymph nodes positive for disease.  Despite this, he had a low risk Oncotype DX score of 4 and therefore adjuvant chemotherapy was not necessary.  He is currently undergoing XRT and will complete treatment on August 08, 2023.  He was given a prescription for tamoxifen today and instructed to initiate treatment at the conclusion of XRT.  Recommendation is to take 5 years of tamoxifen completing in August 2029.  No further interventions are needed.  Return to clinic in 3 months for further evaluation.    I  spent a total of 20 minutes reviewing chart data, face-to-face evaluation with the patient, counseling and coordination of care as detailed above.   Patient expressed understanding and was in agreement with this plan. He also understands that He can call clinic at any time with any questions, concerns, or complaints.    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

## 2023-08-01 ENCOUNTER — Other Ambulatory Visit: Payer: Self-pay

## 2023-08-01 ENCOUNTER — Ambulatory Visit: Payer: Medicare Other

## 2023-08-01 ENCOUNTER — Ambulatory Visit: Admission: RE | Admit: 2023-08-01 | Payer: Medicare Other | Source: Ambulatory Visit

## 2023-08-01 ENCOUNTER — Ambulatory Visit
Admission: RE | Admit: 2023-08-01 | Discharge: 2023-08-01 | Disposition: A | Discharge status: Home / Self Care | Payer: Medicare Other | Source: Ambulatory Visit | Attending: Radiation Oncology | Admitting: Radiation Oncology

## 2023-08-01 DIAGNOSIS — Z510 Encounter for antineoplastic radiation therapy: Secondary | ICD-10-CM | POA: Diagnosis not present

## 2023-08-01 LAB — RAD ONC ARIA SESSION SUMMARY
Course Elapsed Days: 41
Plan Fractions Treated to Date: 10
Plan Prescribed Dose Per Fraction: 1.8 Gy
Plan Total Fractions Prescribed: 10
Plan Total Prescribed Dose: 18 Gy
Reference Point Dosage Given to Date: 50.4 Gy
Reference Point Session Dosage Given: 1.8 Gy
Session Number: 28

## 2023-08-02 ENCOUNTER — Ambulatory Visit: Payer: Medicare Other

## 2023-08-02 ENCOUNTER — Ambulatory Visit
Admission: RE | Admit: 2023-08-02 | Discharge: 2023-08-02 | Disposition: A | Discharge status: Home / Self Care | Payer: Medicare Other | Source: Ambulatory Visit | Attending: Radiation Oncology | Admitting: Radiation Oncology

## 2023-08-02 ENCOUNTER — Other Ambulatory Visit: Payer: Self-pay

## 2023-08-02 DIAGNOSIS — Z510 Encounter for antineoplastic radiation therapy: Secondary | ICD-10-CM | POA: Diagnosis not present

## 2023-08-02 LAB — RAD ONC ARIA SESSION SUMMARY
Course Elapsed Days: 42
Plan Fractions Treated to Date: 1
Plan Fractions Treated to Date: 1
Plan Prescribed Dose Per Fraction: 2 Gy
Plan Prescribed Dose Per Fraction: 2 Gy
Plan Total Fractions Prescribed: 5
Plan Total Fractions Prescribed: 5
Plan Total Prescribed Dose: 10 Gy
Plan Total Prescribed Dose: 10 Gy
Reference Point Dosage Given to Date: 2 Gy
Reference Point Dosage Given to Date: 2 Gy
Reference Point Session Dosage Given: 2 Gy
Reference Point Session Dosage Given: 2 Gy
Session Number: 29

## 2023-08-03 ENCOUNTER — Ambulatory Visit: Admission: RE | Admit: 2023-08-03 | Payer: Medicare Other | Source: Ambulatory Visit

## 2023-08-03 ENCOUNTER — Other Ambulatory Visit: Payer: Self-pay

## 2023-08-03 DIAGNOSIS — Z510 Encounter for antineoplastic radiation therapy: Secondary | ICD-10-CM | POA: Diagnosis not present

## 2023-08-03 LAB — RAD ONC ARIA SESSION SUMMARY
Course Elapsed Days: 43
Plan Fractions Treated to Date: 2
Plan Fractions Treated to Date: 2
Plan Prescribed Dose Per Fraction: 2 Gy
Plan Prescribed Dose Per Fraction: 2 Gy
Plan Total Fractions Prescribed: 5
Plan Total Fractions Prescribed: 5
Plan Total Prescribed Dose: 10 Gy
Plan Total Prescribed Dose: 10 Gy
Reference Point Dosage Given to Date: 4 Gy
Reference Point Dosage Given to Date: 4 Gy
Reference Point Session Dosage Given: 2 Gy
Reference Point Session Dosage Given: 2 Gy
Session Number: 30

## 2023-08-04 ENCOUNTER — Other Ambulatory Visit: Payer: Self-pay

## 2023-08-04 ENCOUNTER — Ambulatory Visit
Admission: RE | Admit: 2023-08-04 | Discharge: 2023-08-04 | Disposition: A | Discharge status: Home / Self Care | Payer: Medicare Other | Source: Ambulatory Visit | Attending: Radiation Oncology | Admitting: Radiation Oncology

## 2023-08-04 DIAGNOSIS — Z510 Encounter for antineoplastic radiation therapy: Secondary | ICD-10-CM | POA: Diagnosis not present

## 2023-08-04 LAB — RAD ONC ARIA SESSION SUMMARY
Course Elapsed Days: 44
Plan Fractions Treated to Date: 3
Plan Fractions Treated to Date: 3
Plan Prescribed Dose Per Fraction: 2 Gy
Plan Prescribed Dose Per Fraction: 2 Gy
Plan Total Fractions Prescribed: 5
Plan Total Fractions Prescribed: 5
Plan Total Prescribed Dose: 10 Gy
Plan Total Prescribed Dose: 10 Gy
Reference Point Dosage Given to Date: 6 Gy
Reference Point Dosage Given to Date: 6 Gy
Reference Point Session Dosage Given: 2 Gy
Reference Point Session Dosage Given: 2 Gy
Session Number: 31

## 2023-08-05 ENCOUNTER — Ambulatory Visit
Admission: RE | Admit: 2023-08-05 | Discharge: 2023-08-05 | Disposition: A | Discharge status: Home / Self Care | Payer: Medicare Other | Source: Ambulatory Visit | Attending: Radiation Oncology | Admitting: Radiation Oncology

## 2023-08-05 ENCOUNTER — Ambulatory Visit: Payer: Medicare Other

## 2023-08-05 ENCOUNTER — Other Ambulatory Visit: Payer: Self-pay

## 2023-08-05 DIAGNOSIS — Z510 Encounter for antineoplastic radiation therapy: Secondary | ICD-10-CM | POA: Diagnosis not present

## 2023-08-05 LAB — RAD ONC ARIA SESSION SUMMARY
Course Elapsed Days: 45
Plan Fractions Treated to Date: 4
Plan Fractions Treated to Date: 4
Plan Prescribed Dose Per Fraction: 2 Gy
Plan Prescribed Dose Per Fraction: 2 Gy
Plan Total Fractions Prescribed: 5
Plan Total Fractions Prescribed: 5
Plan Total Prescribed Dose: 10 Gy
Plan Total Prescribed Dose: 10 Gy
Reference Point Dosage Given to Date: 8 Gy
Reference Point Dosage Given to Date: 8 Gy
Reference Point Session Dosage Given: 2 Gy
Reference Point Session Dosage Given: 2 Gy
Session Number: 32

## 2023-08-08 ENCOUNTER — Ambulatory Visit
Admission: RE | Admit: 2023-08-08 | Discharge: 2023-08-08 | Disposition: A | Discharge status: Home / Self Care | Payer: Medicare Other | Source: Ambulatory Visit | Attending: Radiation Oncology | Admitting: Radiation Oncology

## 2023-08-08 ENCOUNTER — Other Ambulatory Visit: Payer: Self-pay

## 2023-08-08 ENCOUNTER — Encounter: Payer: Self-pay | Admitting: *Deleted

## 2023-08-08 DIAGNOSIS — Z510 Encounter for antineoplastic radiation therapy: Secondary | ICD-10-CM | POA: Diagnosis not present

## 2023-08-08 LAB — RAD ONC ARIA SESSION SUMMARY
Course Elapsed Days: 48
Plan Fractions Treated to Date: 5
Plan Fractions Treated to Date: 5
Plan Prescribed Dose Per Fraction: 2 Gy
Plan Prescribed Dose Per Fraction: 2 Gy
Plan Total Fractions Prescribed: 5
Plan Total Fractions Prescribed: 5
Plan Total Prescribed Dose: 10 Gy
Plan Total Prescribed Dose: 10 Gy
Reference Point Dosage Given to Date: 10 Gy
Reference Point Dosage Given to Date: 10 Gy
Reference Point Session Dosage Given: 2 Gy
Reference Point Session Dosage Given: 2 Gy
Session Number: 33

## 2023-08-17 ENCOUNTER — Inpatient Hospital Stay: Payer: Medicare Other | Admitting: Occupational Therapy

## 2023-08-17 DIAGNOSIS — Z170 Estrogen receptor positive status [ER+]: Secondary | ICD-10-CM | POA: Insufficient documentation

## 2023-08-17 DIAGNOSIS — Z9012 Acquired absence of left breast and nipple: Secondary | ICD-10-CM

## 2023-08-17 DIAGNOSIS — M96843 Postprocedural seroma of a musculoskeletal structure following other procedure: Secondary | ICD-10-CM | POA: Insufficient documentation

## 2023-08-17 DIAGNOSIS — C50922 Malignant neoplasm of unspecified site of left male breast: Secondary | ICD-10-CM | POA: Insufficient documentation

## 2023-08-17 DIAGNOSIS — Z87891 Personal history of nicotine dependence: Secondary | ICD-10-CM | POA: Insufficient documentation

## 2023-08-17 DIAGNOSIS — Z803 Family history of malignant neoplasm of breast: Secondary | ICD-10-CM | POA: Insufficient documentation

## 2023-08-17 DIAGNOSIS — M25612 Stiffness of left shoulder, not elsewhere classified: Secondary | ICD-10-CM

## 2023-08-17 NOTE — Therapy (Signed)
[facility] [address] [place], [place], [address] Phone: [phone]   Fax:  [phone]  Occupational Therapy Screen:  Patient Details  Name: Juan Calhoun MRN: [id number] Date of Birth: [birth year] No data recorded  Encounter Date: 08/17/2023    Past Medical History:  Diagnosis Date   Cataract cortical, senile    Chronic back pain    CKD (chronic kidney disease) stage 3, GFR 30-59 ml/min (HCC)    Diabetes mellitus without complication (HCC)    External hemorrhoids    Hypercholesterolemia    Hyperlipidemia    Hyperplastic colon polyp    Hypertension    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   Osteoarthritis     Past Surgical History:  Procedure Laterality Date   APPENDECTOMY     AXILLARY LYMPH NODE DISSECTION Left 05/05/2023   Procedure: AXILLARY LYMPH NODE DISSECTION;  Surgeon: Sung Amabile, DO;  Location: [facility] ORS;  Service: General;  Laterality: Left;   BREAST BIOPSY Left 04/01/2023   u/s bx, COIL clip- [facility]   BREAST BIOPSY Left 03/31/2023   u/s bx axilla-HYDROMARK(coil) METASTATIC   BREAST BIOPSY Left 04/01/2023   [place] LT BREAST BX W LOC DEV 1ST LESION IMG BX SPEC US GUIDE 04/01/2023 [facility]   BREAST BIOPSY Right 04/20/2023   (NOT DONE)    [place] RT BREAST BX W LOC DEV 1ST LESION IMG BX SPEC US GUIDE 04/20/2023 [facility]   BREAST BIOPSY Left 05/03/2023   [place] LT RADIO FREQUENCY TAG LOC US GUIDE 05/03/2023 [facility]   CATARACT EXTRACTION Bilateral    CHOLECYSTECTOMY     COLONOSCOPY  01/27/2021   COLONOSCOPY WITH PROPOFOL N/A 09/19/2015   Procedure: COLONOSCOPY WITH PROPOFOL;  Surgeon: Elnita Maxwell, MD;  Location: [facility] END[facility]COPY;  Service: Endoscopy;  Laterality: N/A;   TOTAL MASTECTOMY Left 05/05/2023   Procedure: TOTAL MASTECTOMY w/ RF tag breast and axillary;  Surgeon: Sung Amabile, DO;  Location: [facility];  Service: General;  Laterality: Left;    There were no  vitals filed for this visit.   Subjective Assessment - 08/17/23 1046     Subjective  Done with my radiation 2 wks ago - I notice my R wrist and hand more swollen after they done the booster- the seroma got larger again - I need to call Dr Tonna Boehringer    Currently in Pain? No/denies                 LYMPHEDEMA/ONCOLOGY QUESTIONNAIRE - 08/17/23 0001       Right Upper Extremity Lymphedema   15 cm Proximal to Olecranon Process 30 cm    10 cm Proximal to Olecranon Process 28 cm    Olecranon Process 26.5 cm    15 cm Proximal to Ulnar Styloid Process 25.5 cm    10 cm Proximal to Ulnar Styloid Process 22 cm    Just Proximal to Ulnar Styloid Process 18 cm    Across Hand at [facility] 22.5 cm      Left Upper Extremity Lymphedema   15 cm Proximal to Olecranon Process 29.8 cm    10 cm Proximal to Olecranon Process 27.5 cm    Olecranon Process 26.8 cm    15 cm Proximal to Ulnar Styloid Process 25.5 cm    10 cm Proximal to Ulnar Styloid Process 21.3 cm    Just Proximal to Ulnar Styloid Process 18.5 cm    Across Hand at [facility]  23 cm             Dr Tonna Boehringer note 07/19/23: Assessment:    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 Now with seroma. Proceed with bedside US guided drainage as noted below:  Pre-op: post op seroma Post op: same Procedure: needle aspiration of seroma using US guidance  EBL: minimal: Complication: none apparent Description of procedure: after obtaining verbal consent, area sterilized in usual fashion. [place] bedside confirmed homogenous hypoechoic area consistent with seroma. Skin anesthesized and 18gauze needle placed in lateral aspect of seroma. Immediate drainage of thin, straw colored fluid noted and drained, approximately . [place] again used at end to ensure majority of fluid drained, scant area noted on [place] was  reached with needle under direct visualization but no further fluid able to be aspirated and drained. Needle removed and area covered with 4x4 gauze and tape. Pt tolerated procedure well.  F/u prn if recurrence, which is at high risk due to size of initial seroma. Encouraged using ACE wrap to keep pressure around area. All questions addressed        OT SCREEN 07/20/23: Pt follow up with OT about 1/2 ways thru radiation . AROM  in L shoulder WNL - some tightness in chest - but had seroma drained yesterday Remind pt again to cont with doing AAROM for shoulder flexion and ABD in shower in the am to maintain his flexibility thru out radiation. As well as scapula retraction for posture few times during the day Circumference in bilateral UE compare to each other WNL  Pt to follow up after radiation again      OT SCREEN 08/17/23: Patient arrived for follow-up after finishing radiation.  Patient arrived with increased swelling in the wrist and hand on the left.  Patient report increased aroma again since last drain in by Dr. Tonna Boehringer.  Not as tight and large this time.  Planning to contact Dr. Tonna Boehringer to see if needs to be drained. Left upper extremity circumference measurements increased.  Patient report hand and wrist started swelling the last week or 2. Patient was fitted with a Isotoner glove and a Tubigrip D compression from hand to elbow for a week to wear as much as he can. Will reassess next week.  Cannot do any manual therapy over radiation area.  Appear patient has some lymphatic cording into the left upper arm into the elbow motion limiting and visible during shoulder abduction. 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

## 2023-08-24 ENCOUNTER — Inpatient Hospital Stay: Payer: Medicare Other | Admitting: Occupational Therapy

## 2023-08-24 DIAGNOSIS — M25612 Stiffness of left shoulder, not elsewhere classified: Secondary | ICD-10-CM

## 2023-08-24 NOTE — Therapy (Signed)
[facility] at [facility] [address], [address] [place], [place], [address] Phone: [phone]   Fax:  [phone]  Occupational Therapy Screen  Patient Details  Name: Juan Calhoun MRN: [id number] Date of Birth: March 06, 1941 No data recorded  Encounter Date: 08/24/2023   OT End of Session - 08/24/23 0924     Visit Number 0             Past Medical History:  Diagnosis Date   Cataract cortical, senile    Chronic back pain    CKD (chronic kidney disease) stage 3, GFR 30-59 ml/min (HCC)    Diabetes mellitus without complication (HCC)    External hemorrhoids    Hypercholesterolemia    Hyperlipidemia    Hyperplastic colon polyp    Hypertension    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   Osteoarthritis     Past Surgical History:  Procedure Laterality Date   APPENDECTOMY     AXILLARY LYMPH NODE DISSECTION Left 05/05/2023   Procedure: AXILLARY LYMPH NODE DISSECTION;  Surgeon: Sung Amabile, DO;  Location: [facility];  Service: General;  Laterality: Left;   BREAST BIOPSY Left 04/01/2023   u/s bx, COIL clip- [facility]   BREAST BIOPSY Left 03/31/2023   u/s bx axilla-HYDROMARK(coil) METASTATIC   BREAST BIOPSY Left 04/01/2023   [place] LT BREAST BX W LOC DEV 1ST LESION IMG BX SPEC US GUIDE 04/01/2023 [facility]   BREAST BIOPSY Right 04/20/2023   (NOT DONE)    [place] RT BREAST BX W LOC DEV 1ST LESION IMG BX SPEC US GUIDE 04/20/2023 [facility]   BREAST BIOPSY Left 05/03/2023   [place] LT RADIO FREQUENCY TAG LOC US GUIDE 05/03/2023 [facility]   CATARACT EXTRACTION Bilateral    CHOLECYSTECTOMY     COLONOSCOPY  01/27/2021   COLONOSCOPY WITH PROPOFOL N/A 09/19/2015   Procedure: COLONOSCOPY WITH PROPOFOL;  Surgeon: Elnita Maxwell, MD;  Location: [facility] ENDOSCOPY;  Service: Endoscopy;  Laterality: N/A;   TOTAL MASTECTOMY Left 05/05/2023   Procedure: TOTAL MASTECTOMY w/ RF tag breast and axillary;  Surgeon: Sung Amabile, DO;   Location: [facility];  Service: General;  Laterality: Left;    There were no vitals filed for this visit.   Subjective Assessment - 08/24/23 0923     Subjective  Dr. Psychiatry might Juan Calhoun again yesterday.  Looks much better.  I wore the glove with a compression sleeve for my forearm mostly at nighttime.  I still feel the cord with tightness into my upper arm    Currently in Pain? 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 Now with seroma. Proceed with  bedside US guided drainage as noted below:  Pre-op: post op seroma Post op: same Procedure: needle aspiration of seroma using US guidance  EBL: minimal: Complication: none apparent Description of procedure: after obtaining verbal consent, area sterilized in usual fashion. [place] bedside confirmed homogenous hypoechoic area consistent with seroma. Skin anesthesized and 18gauze needle placed in lateral aspect of seroma. Immediate drainage of thin, straw colored fluid noted and drained, approximately . [place] again used at end to ensure majority of fluid drained, scant area noted on [place] was reached with needle under direct visualization but no further fluid able to be aspirated and drained. Needle removed and area covered with 4x4 gauze and tape. Pt tolerated procedure well.  F/u prn if recurrence, which is at high risk  due to size of initial seroma. Encouraged using ACE wrap to keep pressure around area. All questions addressed         OT SCREEN 07/20/23: Pt follow up with OT about 1/2 ways thru radiation . AROM  in L shoulder WNL - some tightness in chest - but had seroma drained yesterday Remind pt again to cont with doing AAROM for shoulder flexion and ABD in shower in the am to maintain his flexibility thru out radiation. As well as scapula retraction for posture few times during the day Circumference in bilateral UE compare to each other WNL  Pt to follow up after radiation again        OT SCREEN 08/17/23: Patient arrived for follow-up after finishing radiation.  Patient arrived with increased swelling in the wrist and hand on the left.  Patient report increased aroma again since last drain in by Dr. Tonna Boehringer.  Not as tight and large this time.  Planning to contact Dr. Tonna Boehringer to see if needs to be drained. Left upper extremity circumference measurements increased.  Patient report hand and wrist started swelling the last week or 2. Patient was fitted with a Isotoner glove and a Tubigrip D compression from hand to elbow for a week to wear as much as he can. Will reassess next week.  Cannot do any manual therapy over radiation area.  Appear patient has some lymphatic cording into the left upper arm into the elbow motion limiting and visible during shoulder abduction. Will follow-up with patient next week   OT SCREEN 08/24/23:   Pt follow up with OT  after L UE circumference was increase last week and had seroma again - radiation was done.  Pt had seroma drained yesterday by DR Tonna Boehringer. Remind pt again to cont with doing AAROM for shoulder flexion and ABD - pt has some cording in L axilla into upper arm - I attempted to release in upper arm - was able to get 2 small releases- but ed pt on applying traction on chest and do shoulder ABD against traction of skin at chest - at scar tissue. OT cannot do any manual  therapy over area of radiation until 6 weeks post last session.   Since seroma drained and compression last week applied to hand and forearm left upper extremity circumference decreased.  Recommend for patient to get a unilateral postmastectomy breast pad to wear with Ace wrap on left thoracic to decrease return of seroma as well as clearing thoracic for left upper extremity drain - wear as much as he can.  Also to continue compression glove . 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

## 2023-09-12 ENCOUNTER — Encounter: Payer: Self-pay | Admitting: Radiation Oncology

## 2023-09-12 ENCOUNTER — Ambulatory Visit
Admission: RE | Admit: 2023-09-12 | Discharge: 2023-09-12 | Disposition: A | Discharge status: Home / Self Care | Payer: Medicare Other | Source: Ambulatory Visit | Attending: Radiation Oncology | Admitting: Radiation Oncology

## 2023-09-12 VITALS — BP 111/67 | HR 73 | Temp 95.6°F | Resp 16 | Ht 72.0 in | Wt 202.1 lb

## 2023-09-12 DIAGNOSIS — C50922 Malignant neoplasm of unspecified site of left male breast: Secondary | ICD-10-CM | POA: Insufficient documentation

## 2023-09-12 NOTE — Progress Notes (Signed)
Radiation Oncology Follow up Note  Name: Juan Calhoun   Date:   09/12/2023 MRN:  [id number] DOB: 12-27-1941    This 82 y.o. 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  REFERRING PROVIDER: Lauro Regulus, MD  HPI: Patient is a 82 year old male now out 1 month having completed adjuvant radiation therapy to his left chest wall and peripheral lymphatics for stage IIb invasive mammary carcinoma of the left breast.  Seen today in follow-up he is doing well specifically denies any chest wall pain or discomfort any swelling in his left upper extremity or any skin nodules.Marland Kitchen  He is currently on tamoxifen tolerating that well.  COMPLICATIONS OF TREATMENT: none  FOLLOW UP COMPLIANCE: keeps appointments   PHYSICAL EXAM:  BP 111/67   Pulse 73   Temp (!) 95.6 F (35.3 C)   Resp 16   Ht 6' (1.829 m)   Wt 202 lb 1.6 oz (91.7 kg)   BMI 27.41 kg/m  Left chest wall is well-healed.  The seroma there has been significantly reduced in size.  No evidence of chest wall mass or nodularity is noted no evidence of peripheral edema is identified in the upper extremity.  Well-developed well-nourished patient in NAD. HEENT reveals PERLA, EOMI, discs not visualized.  Oral cavity is clear. No oral mucosal lesions are identified. Neck is clear without evidence of cervical or supraclavicular adenopathy. Lungs are clear to A&P. Cardiac examination is essentially unremarkable with regular rate and rhythm without murmur rub or thrill. Abdomen is benign with no organomegaly or masses noted. Motor sensory and DTR levels are equal and symmetric in the upper and lower extremities. Cranial nerves II through XII are grossly intact. Proprioception is intact. No peripheral adenopathy or edema is identified. No motor or sensory levels are noted. Crude visual  fields are within normal range.  RADIOLOGY RESULTS: No current films to review  PLAN: Present time patient is doing well 1 month out from adjuvant radiation therapy to his left chest wall.  And pleased with his overall progress.  Of asked to see him back in 6 months for follow-up.  He continues close follow-up care with medical oncology.  Patient knows to call with any concerns.  I would like to take this opportunity to thank you for allowing me to participate in the care of your patient.Juan Miller, MD

## 2023-09-21 ENCOUNTER — Inpatient Hospital Stay: Payer: Medicare Other | Attending: Oncology | Admitting: Occupational Therapy

## 2023-09-21 DIAGNOSIS — M25612 Stiffness of left shoulder, not elsewhere classified: Secondary | ICD-10-CM

## 2023-09-21 DIAGNOSIS — Z9012 Acquired absence of left breast and nipple: Secondary | ICD-10-CM

## 2023-09-21 DIAGNOSIS — C50922 Malignant neoplasm of unspecified site of left male breast: Secondary | ICD-10-CM

## 2023-09-21 NOTE — Therapy (Signed)
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) 04/05/2023    Oletta Cohn, OTR/L,CLT 09/21/2023, 5:10 PM  [place] [facility] [address] [place], [place], [address] Phone: [phone]   Fax:  [phone]  Name: Juan Calhoun MRN: [id number] Date of Birth: 10-11-1941  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) 04/05/2023    Oletta Cohn, OTR/L,CLT 09/21/2023, 5:10 PM  [place] [facility] [address] [place], [place], [address] Phone: [phone]   Fax:  [phone]  Name: Juan Calhoun MRN: [id number] Date of Birth: 10-11-1941  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) 04/05/2023    Oletta Cohn, OTR/L,CLT 09/21/2023, 5:10 PM  [place] [facility] [address] [place], [place], [address] Phone: [phone]   Fax:  [phone]  Name: Juan Calhoun MRN: [id number] Date of Birth: 10-11-1941  [facility] [address] [place], [place], [address] Phone: [phone]   Fax:  [phone]  Occupational Therapy Screen  Patient Details  Name: Juan Calhoun MRN: [id number] Date of Birth: 1941/10/30 No data recorded  Encounter Date: 09/21/2023   OT End of Session - 09/21/23 1710     Visit Number 0             Past Medical History:  Diagnosis Date   Cataract cortical, senile    Chronic back pain    CKD (chronic kidney disease) stage 3, GFR 30-59 ml/min (HCC)    Diabetes mellitus without complication (HCC)    External hemorrhoids    Hypercholesterolemia    Hyperlipidemia    Hyperplastic colon polyp    Hypertension    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   Osteoarthritis     Past Surgical History:  Procedure Laterality Date   APPENDECTOMY     AXILLARY LYMPH NODE DISSECTION Left 05/05/2023   Procedure: AXILLARY LYMPH NODE DISSECTION;  Surgeon: Sung Amabile, DO;  Location: [facility];  Service: General;  Laterality: Left;   BREAST BIOPSY Left 04/01/2023   u/s bx, COIL clip- [facility]   BREAST BIOPSY Left 03/31/2023   u/s bx axilla-HYDROMARK(coil) METASTATIC   BREAST BIOPSY Left 04/01/2023   [place] LT BREAST BX W LOC DEV 1ST LESION IMG BX SPEC US GUIDE 04/01/2023 [facility]   BREAST BIOPSY Right 04/20/2023   (NOT DONE)    [place] RT BREAST BX W LOC DEV 1ST LESION IMG BX SPEC US GUIDE 04/20/2023 [facility]   BREAST BIOPSY Left 05/03/2023   [place] LT RADIO FREQUENCY TAG LOC US GUIDE 05/03/2023 [facility]   CATARACT EXTRACTION Bilateral    CHOLECYSTECTOMY     COLONOSCOPY  01/27/2021   COLONOSCOPY WITH PROPOFOL N/A 09/19/2015   Procedure: COLONOSCOPY WITH PROPOFOL;  Surgeon: Elnita Maxwell, MD;  Location: [facility] ENDOSCOPY;  Service: Endoscopy;  Laterality: N/A;   TOTAL MASTECTOMY Left 05/05/2023   Procedure: TOTAL MASTECTOMY w/ RF tag breast and axillary;  Surgeon: Sung Amabile, DO;   Location: [facility];  Service: General;  Laterality: Left;    There were no vitals filed for this visit.   Subjective Assessment - 09/21/23 1709     Subjective  I did wear the jovipak for about 2 wks but not really the last 2 wks- some swelling but not as bad as it was in past - swelling in the arm feels better- and I am doing some motion HEP for my shoulder if I feel like it is tight or stiff                 LYMPHEDEMA/ONCOLOGY QUESTIONNAIRE - 09/21/23 0001       Right Upper Extremity Lymphedema   15 cm Proximal to Olecranon Process 31 cm    10 cm Proximal to Olecranon Process 28 cm    Olecranon Process 26.2 cm    15 cm Proximal to Ulnar Styloid Process 25.3 cm    10 cm Proximal to Ulnar Styloid Process 22 cm    Just Proximal to Ulnar Styloid Process 18.2 cm    Across Hand at [facility] 22.5 cm      Left Upper Extremity Lymphedema   15 cm Proximal to Olecranon Process 29.4 cm    10 cm Proximal to Olecranon Process 27 cm    Olecranon Process 26.4 cm    15 cm Proximal to  [facility] [address] [place], [place], [address] Phone: [phone]   Fax:  [phone]  Occupational Therapy Screen  Patient Details  Name: Juan Calhoun MRN: [id number] Date of Birth: 1941/10/30 No data recorded  Encounter Date: 09/21/2023   OT End of Session - 09/21/23 1710     Visit Number 0             Past Medical History:  Diagnosis Date   Cataract cortical, senile    Chronic back pain    CKD (chronic kidney disease) stage 3, GFR 30-59 ml/min (HCC)    Diabetes mellitus without complication (HCC)    External hemorrhoids    Hypercholesterolemia    Hyperlipidemia    Hyperplastic colon polyp    Hypertension    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   Osteoarthritis     Past Surgical History:  Procedure Laterality Date   APPENDECTOMY     AXILLARY LYMPH NODE DISSECTION Left 05/05/2023   Procedure: AXILLARY LYMPH NODE DISSECTION;  Surgeon: Sung Amabile, DO;  Location: [facility];  Service: General;  Laterality: Left;   BREAST BIOPSY Left 04/01/2023   u/s bx, COIL clip- [facility]   BREAST BIOPSY Left 03/31/2023   u/s bx axilla-HYDROMARK(coil) METASTATIC   BREAST BIOPSY Left 04/01/2023   [place] LT BREAST BX W LOC DEV 1ST LESION IMG BX SPEC US GUIDE 04/01/2023 [facility]   BREAST BIOPSY Right 04/20/2023   (NOT DONE)    [place] RT BREAST BX W LOC DEV 1ST LESION IMG BX SPEC US GUIDE 04/20/2023 [facility]   BREAST BIOPSY Left 05/03/2023   [place] LT RADIO FREQUENCY TAG LOC US GUIDE 05/03/2023 [facility]   CATARACT EXTRACTION Bilateral    CHOLECYSTECTOMY     COLONOSCOPY  01/27/2021   COLONOSCOPY WITH PROPOFOL N/A 09/19/2015   Procedure: COLONOSCOPY WITH PROPOFOL;  Surgeon: Elnita Maxwell, MD;  Location: [facility] ENDOSCOPY;  Service: Endoscopy;  Laterality: N/A;   TOTAL MASTECTOMY Left 05/05/2023   Procedure: TOTAL MASTECTOMY w/ RF tag breast and axillary;  Surgeon: Sung Amabile, DO;   Location: [facility];  Service: General;  Laterality: Left;    There were no vitals filed for this visit.   Subjective Assessment - 09/21/23 1709     Subjective  I did wear the jovipak for about 2 wks but not really the last 2 wks- some swelling but not as bad as it was in past - swelling in the arm feels better- and I am doing some motion HEP for my shoulder if I feel like it is tight or stiff                 LYMPHEDEMA/ONCOLOGY QUESTIONNAIRE - 09/21/23 0001       Right Upper Extremity Lymphedema   15 cm Proximal to Olecranon Process 31 cm    10 cm Proximal to Olecranon Process 28 cm    Olecranon Process 26.2 cm    15 cm Proximal to Ulnar Styloid Process 25.3 cm    10 cm Proximal to Ulnar Styloid Process 22 cm    Just Proximal to Ulnar Styloid Process 18.2 cm    Across Hand at [facility] 22.5 cm      Left Upper Extremity Lymphedema   15 cm Proximal to Olecranon Process 29.4 cm    10 cm Proximal to Olecranon Process 27 cm    Olecranon Process 26.4 cm    15 cm Proximal to

## 2023-09-21 NOTE — Progress Notes (Signed)
Survivorship Care Plan visit completed.  Treatment summary reviewed and given to patient.  ASCO answers booklet reviewed and given to patient.  CARE program and Cancer Transitions discussed with patient along with other resources cancer center offers to patients and caregivers.  Patient verbalized understanding.

## 2023-10-12 ENCOUNTER — Ambulatory Visit: Payer: Medicare Other | Attending: Internal Medicine | Admitting: Occupational Therapy

## 2023-10-12 DIAGNOSIS — N6489 Other specified disorders of breast: Secondary | ICD-10-CM | POA: Insufficient documentation

## 2023-10-12 NOTE — Therapy (Signed)
[facility] [address] [place], [place], [address] Phone: [phone]   Fax:  [phone]  Occupational Therapy Screen   Patient Details  Name: Juan Calhoun MRN: [id number] Date of Birth: 24-Apr-1941 No data recorded  Encounter Date: 10/12/2023   OT End of Session - 10/12/23 1220     Visit Number 0             Past Medical History:  Diagnosis Date   Cataract cortical, senile    Chronic back pain    CKD (chronic kidney disease) stage 3, GFR 30-59 ml/min (HCC)    Diabetes mellitus without complication (HCC)    External hemorrhoids    Hypercholesterolemia    Hyperlipidemia    Hyperplastic colon polyp    Hypertension    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   Osteoarthritis     Past Surgical History:  Procedure Laterality Date   APPENDECTOMY     AXILLARY LYMPH NODE DISSECTION Left 05/05/2023   Procedure: AXILLARY LYMPH NODE DISSECTION;  Surgeon: Sung Amabile, DO;  Location: [facility];  Service: General;  Laterality: Left;   BREAST BIOPSY Left 04/01/2023   u/s bx, COIL clip- [facility]   BREAST BIOPSY Left 03/31/2023   u/s bx axilla-HYDROMARK(coil) METASTATIC   BREAST BIOPSY Left 04/01/2023   [place] LT BREAST BX W LOC DEV 1ST LESION IMG BX SPEC US GUIDE 04/01/2023 [facility]   BREAST BIOPSY Right 04/20/2023   (NOT DONE)    [place] RT BREAST BX W LOC DEV 1ST LESION IMG BX SPEC US GUIDE 04/20/2023 [facility]   BREAST BIOPSY Left 05/03/2023   [place] LT RADIO FREQUENCY TAG LOC US GUIDE 05/03/2023 [facility]   CATARACT EXTRACTION Bilateral    CHOLECYSTECTOMY     COLONOSCOPY  01/27/2021   COLONOSCOPY WITH PROPOFOL N/A 09/19/2015   Procedure: COLONOSCOPY WITH PROPOFOL;  Surgeon: Elnita Maxwell, MD;  Location: [facility] ENDOSCOPY;  Service: Endoscopy;  Laterality: N/A;   TOTAL MASTECTOMY Left 05/05/2023   Procedure: TOTAL MASTECTOMY w/ RF tag breast and axillary;  Surgeon: Sung Amabile, DO;  Location:  [facility];  Service: General;  Laterality: Left;    There were no vitals filed for this visit.   Subjective Assessment - 10/12/23 1220     Subjective  I had seroma drained last Tues by Dr Maia Plan - and wearing since I seen you last the jovipak about 2/3 of the time - Doing okay                 LYMPHEDEMA/ONCOLOGY QUESTIONNAIRE - 10/12/23 0001       Left Upper Extremity Lymphedema   15 cm Proximal to Olecranon Process 29.5 cm    10 cm Proximal to Olecranon Process 27 cm    Olecranon Process 26.4 cm    15 cm Proximal to Ulnar Styloid Process 24.5 cm    10 cm Proximal to Ulnar Styloid Process 21 cm    Just Proximal to Ulnar Styloid Process 18 cm    Across Hand at [facility] 22 cm                                OT SCREEN 07/20/23: Pt follow up with OT about 1/2 ways thru radiation . AROM  in L shoulder WNL - some tightness in chest - but had seroma drained yesterday Remind pt again to cont with doing AAROM for  [facility] [address] [place], [place], [address] Phone: [phone]   Fax:  [phone]  Occupational Therapy Screen   Patient Details  Name: Juan Calhoun MRN: [id number] Date of Birth: 24-Apr-1941 No data recorded  Encounter Date: 10/12/2023   OT End of Session - 10/12/23 1220     Visit Number 0             Past Medical History:  Diagnosis Date   Cataract cortical, senile    Chronic back pain    CKD (chronic kidney disease) stage 3, GFR 30-59 ml/min (HCC)    Diabetes mellitus without complication (HCC)    External hemorrhoids    Hypercholesterolemia    Hyperlipidemia    Hyperplastic colon polyp    Hypertension    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   Osteoarthritis     Past Surgical History:  Procedure Laterality Date   APPENDECTOMY     AXILLARY LYMPH NODE DISSECTION Left 05/05/2023   Procedure: AXILLARY LYMPH NODE DISSECTION;  Surgeon: Sung Amabile, DO;  Location: [facility];  Service: General;  Laterality: Left;   BREAST BIOPSY Left 04/01/2023   u/s bx, COIL clip- [facility]   BREAST BIOPSY Left 03/31/2023   u/s bx axilla-HYDROMARK(coil) METASTATIC   BREAST BIOPSY Left 04/01/2023   [place] LT BREAST BX W LOC DEV 1ST LESION IMG BX SPEC US GUIDE 04/01/2023 [facility]   BREAST BIOPSY Right 04/20/2023   (NOT DONE)    [place] RT BREAST BX W LOC DEV 1ST LESION IMG BX SPEC US GUIDE 04/20/2023 [facility]   BREAST BIOPSY Left 05/03/2023   [place] LT RADIO FREQUENCY TAG LOC US GUIDE 05/03/2023 [facility]   CATARACT EXTRACTION Bilateral    CHOLECYSTECTOMY     COLONOSCOPY  01/27/2021   COLONOSCOPY WITH PROPOFOL N/A 09/19/2015   Procedure: COLONOSCOPY WITH PROPOFOL;  Surgeon: Elnita Maxwell, MD;  Location: [facility] ENDOSCOPY;  Service: Endoscopy;  Laterality: N/A;   TOTAL MASTECTOMY Left 05/05/2023   Procedure: TOTAL MASTECTOMY w/ RF tag breast and axillary;  Surgeon: Sung Amabile, DO;  Location:  [facility];  Service: General;  Laterality: Left;    There were no vitals filed for this visit.   Subjective Assessment - 10/12/23 1220     Subjective  I had seroma drained last Tues by Dr Maia Plan - and wearing since I seen you last the jovipak about 2/3 of the time - Doing okay                 LYMPHEDEMA/ONCOLOGY QUESTIONNAIRE - 10/12/23 0001       Left Upper Extremity Lymphedema   15 cm Proximal to Olecranon Process 29.5 cm    10 cm Proximal to Olecranon Process 27 cm    Olecranon Process 26.4 cm    15 cm Proximal to Ulnar Styloid Process 24.5 cm    10 cm Proximal to Ulnar Styloid Process 21 cm    Just Proximal to Ulnar Styloid Process 18 cm    Across Hand at [facility] 22 cm                                OT SCREEN 07/20/23: Pt follow up with OT about 1/2 ways thru radiation . AROM  in L shoulder WNL - some tightness in chest - but had seroma drained yesterday Remind pt again to cont with doing AAROM for  12:23 PM  [facility] [address] [place], [address] Phone: [phone]   Fax:  [phone]  Name: MARON STANZIONE MRN: [id number] Date of Birth: [birth year]  12:23 PM  [facility] [address] [place], [address] Phone: [phone]   Fax:  [phone]  Name: MARON STANZIONE MRN: [id number] Date of Birth: [birth year]  12:23 PM  [facility] [address] [place], [address] Phone: [phone]   Fax:  [phone]  Name: MARON STANZIONE MRN: [id number] Date of Birth: [birth year]

## 2023-10-28 ENCOUNTER — Encounter: Payer: Self-pay | Admitting: Oncology

## 2023-10-28 ENCOUNTER — Inpatient Hospital Stay: Payer: Medicare Other | Attending: Oncology | Admitting: Oncology

## 2023-10-28 VITALS — BP 107/71 | HR 71 | Temp 97.3°F | Resp 16 | Ht 72.0 in | Wt 202.6 lb

## 2023-10-28 DIAGNOSIS — Z1732 Human epidermal growth factor receptor 2 negative status: Secondary | ICD-10-CM | POA: Diagnosis not present

## 2023-10-28 DIAGNOSIS — Z87891 Personal history of nicotine dependence: Secondary | ICD-10-CM | POA: Diagnosis not present

## 2023-10-28 DIAGNOSIS — Z170 Estrogen receptor positive status [ER+]: Secondary | ICD-10-CM | POA: Diagnosis not present

## 2023-10-28 DIAGNOSIS — Z923 Personal history of irradiation: Secondary | ICD-10-CM | POA: Diagnosis not present

## 2023-10-28 DIAGNOSIS — C50922 Malignant neoplasm of unspecified site of left male breast: Secondary | ICD-10-CM | POA: Diagnosis not present

## 2023-10-28 DIAGNOSIS — Z9012 Acquired absence of left breast and nipple: Secondary | ICD-10-CM | POA: Insufficient documentation

## 2023-10-28 DIAGNOSIS — Z1721 Progesterone receptor positive status: Secondary | ICD-10-CM | POA: Diagnosis not present

## 2023-10-28 DIAGNOSIS — Z803 Family history of malignant neoplasm of breast: Secondary | ICD-10-CM | POA: Insufficient documentation

## 2023-10-28 DIAGNOSIS — Z79810 Long term (current) use of selective estrogen receptor modulators (SERMs): Secondary | ICD-10-CM | POA: Insufficient documentation

## 2023-10-28 MED ORDER — TAMOXIFEN CITRATE 20 MG PO TABS: 20.0000 mg | ORAL_TABLET | Freq: Every day | ORAL | 3 refills | Status: DC

## 2023-10-28 NOTE — Progress Notes (Unsigned)
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  INTERVAL HISTORY: Patient returns to clinic today for further further evaluation and discussion of initiation of tamoxifen.  He is currently getting XRT and tolerating treatment well.  He will complete on August 08, 2023.  He currently feels well and is asymptomatic.  He has no neurologic complaints.  He denies any recent fevers or illnesses.  He has a good appetite and denies weight loss.  He has no chest pain, shortness of breath, cough, or hemoptysis.  He denies any nausea, vomiting, constipation, or diarrhea.  He has no urinary complaints.  Patient offers no specific complaints today.  REVIEW OF SYSTEMS:   Review of Systems  Constitutional: Negative.  Negative for fever, malaise/fatigue and weight loss.  Respiratory: Negative.  Negative for cough, hemoptysis and shortness of breath.   Cardiovascular: Negative.  Negative for chest pain and leg swelling.  Gastrointestinal: Negative.  Negative for abdominal pain.  Genitourinary: Negative.  Negative for dysuria.  Musculoskeletal: Negative.  Negative for back pain.  Skin: Negative.  Negative for rash.  Neurological: Negative.  Negative for dizziness, focal weakness, weakness and headaches.  Psychiatric/Behavioral: Negative.  The patient is not nervous/anxious.     As per HPI. Otherwise, a complete review of systems is negative.  PAST MEDICAL HISTORY: Past Medical History:   Diagnosis Date  . Cataract cortical, senile   . Chronic back pain   . CKD (chronic kidney disease) stage 3, GFR 30-59 ml/min (HCC)   . Diabetes mellitus without complication (HCC)   . External hemorrhoids   . Hypercholesterolemia   . Hyperlipidemia   . Hyperplastic colon polyp   . Hypertension   . 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 Osteoarthritis     PAST SURGICAL HISTORY: Past Surgical History:  Procedure Laterality Date  . APPENDECTOMY    . AXILLARY LYMPH NODE DISSECTION Left 05/05/2023   Procedure: AXILLARY LYMPH NODE DISSECTION;  Surgeon: Sung Amabile, DO;  Location: [facility];  Service: General;  Laterality: Left;  . BREAST BIOPSY Left 04/01/2023   u/s bx, COIL clip- [facility]  . BREAST BIOPSY Left 03/31/2023   u/s bx axilla-HYDROMARK(coil) METASTATIC  . BREAST BIOPSY Left 04/01/2023   [place] LT BREAST BX W LOC DEV 1ST LESION IMG BX SPEC US GUIDE 04/01/2023 [facility]  . BREAST BIOPSY Right 04/20/2023   (NOT DONE)    [place] RT BREAST BX W LOC DEV 1ST LESION IMG BX SPEC US GUIDE 04/20/2023 [facility]  . BREAST BIOPSY Left 05/03/2023   [place] LT RADIO FREQUENCY TAG LOC US GUIDE 05/03/2023 [facility]  . CATARACT EXTRACTION Bilateral   . CHOLECYSTECTOMY    . COLONOSCOPY  01/27/2021  . COLONOSCOPY WITH PROPOFOL N/A 09/19/2015   Procedure: COLONOSCOPY WITH PROPOFOL;  Surgeon: Elnita Maxwell, MD;  Location: [facility] ENDOSCOPY;  Service: Endoscopy;  Laterality: N/A;  . TOTAL MASTECTOMY Left 05/05/2023   Procedure: TOTAL MASTECTOMY w/ RF tag breast and axillary;  Surgeon: Sung Amabile, DO;  Location: [facility];  Service: General;  Laterality: Left;  FAMILY HISTORY: Family History  Problem Relation Age of Onset  . Breast cancer Mother   . Heart disease Father   . Heart attack Brother   . Heart disease Maternal Uncle     ADVANCED DIRECTIVES (Y/N):  N  HEALTH MAINTENANCE: Social History   Tobacco Use  . Smoking status: Former    Current packs/day:  0.00    Types: Cigarettes    Quit date: 1986    Years since quitting: 38.8  . Smokeless tobacco: Never  Vaping Use  . Vaping status: Never Used  Substance Use Topics  . Alcohol use: No  . Drug use: No     Colonoscopy:  PAP:  Bone density:  Lipid panel:  No Known Allergies  Current Outpatient Medications  Medication Sig Dispense Refill  . atorvastatin (LIPITOR) 40 MG tablet Take 40 mg by mouth daily.    . cyanocobalamin (VITAMIN B12) 1000 MCG tablet Take 1,000 mcg by mouth daily.    . tamoxifen (NOLVADEX) 20 MG tablet Take 1 tablet (20 mg total) by mouth daily. 30 tablet 30  . TRULICITY 3 MG/0.5ML SOPN Inject 3 mg into the skin once a week. Sunday     No current facility-administered medications for this visit.    OBJECTIVE: Vitals:   10/28/23 1109  BP: 107/71  Pulse: 71  Resp: 16  Temp: (!) 97.3 F (36.3 C)  SpO2: 100%     Body mass index is 27.48 kg/m.    ECOG FS:0 - Asymptomatic  General: Well-developed, well-nourished, no acute distress. Eyes: Pink conjunctiva, anicteric sclera. HEENT: Normocephalic, moist mucous membranes. Lungs: No audible wheezing or coughing. Heart: Regular rate and rhythm. Abdomen: Soft, nontender, no obvious distention. Musculoskeletal: No edema, cyanosis, or clubbing. Neuro: Alert, answering all questions appropriately. Cranial nerves grossly intact. Skin: No rashes or petechiae noted. Psych: Normal affect.  LAB RESULTS:  Lab Results  Component Value Date   NA 137 05/06/2023   K 4.2 05/06/2023   CL 110 05/06/2023   CO2 21 (L) 05/06/2023   GLUCOSE 174 (H) 05/06/2023   BUN 20 05/06/2023   CREATININE 1.19 05/06/2023   CALCIUM 8.4 (L) 05/06/2023   GFRNONAA >60 05/06/2023    Lab Results  Component Value Date   WBC 8.2 07/25/2023   HGB 15.1 07/25/2023   HCT 46.9 07/25/2023   MCV 94.2 07/25/2023   PLT 127 (L) 07/25/2023     STUDIES: No results found.  ASSESSMENT: Pathologic stage Ia ER/PR positive, HER2 negative  invasive carcinoma of the left breast.  Oncotype score 4.  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  He has noted to have 2 axillary lymph nodes positive for disease.  Despite this, he had a low risk Oncotype DX score of 4 and therefore adjuvant chemotherapy was not necessary.  He is currently undergoing XRT and will complete treatment on August 08, 2023.  He was given a prescription for tamoxifen today and instructed to initiate treatment at the conclusion of XRT.  Recommendation is to take 5 years of tamoxifen completing in August 2029.  No further interventions are needed.  Return to clinic in 3 months for further evaluation.    I spent a total of 20 minutes reviewing chart data, face-to-face evaluation with the patient, counseling and coordination of care as detailed above.   Patient expressed understanding and was in agreement with this plan. He  also understands that He can call clinic at any time with any questions, concerns, or complaints.    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

## 2023-11-11 ENCOUNTER — Ambulatory Visit: Payer: Medicare Other | Attending: Internal Medicine | Admitting: Occupational Therapy

## 2023-11-11 ENCOUNTER — Other Ambulatory Visit: Payer: Self-pay | Admitting: *Deleted

## 2023-11-11 DIAGNOSIS — C50922 Malignant neoplasm of unspecified site of left male breast: Secondary | ICD-10-CM

## 2023-11-11 DIAGNOSIS — N6489 Other specified disorders of breast: Secondary | ICD-10-CM | POA: Insufficient documentation

## 2023-11-11 NOTE — Therapy (Signed)
[facility] [address] [place], [place], [address] Phone: [phone]   Fax:  [phone]  Occupational Therapy Screen  Patient Details  Name: Juan Calhoun MRN: [id number] Date of Birth: 03/24/1941 No data recorded  Encounter Date: 11/11/2023   OT End of Session - 11/11/23 0846     Visit Number 0             Past Medical History:  Diagnosis Date   Cataract cortical, senile    Chronic back pain    CKD (chronic kidney disease) stage 3, GFR 30-59 ml/min (HCC)    Diabetes mellitus without complication (HCC)    External hemorrhoids    Hypercholesterolemia    Hyperlipidemia    Hyperplastic colon polyp    Hypertension    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   Osteoarthritis     Past Surgical History:  Procedure Laterality Date   APPENDECTOMY     AXILLARY LYMPH NODE DISSECTION Left 05/05/2023   Procedure: AXILLARY LYMPH NODE DISSECTION;  Surgeon: Sung Amabile, DO;  Location: [facility] ORS;  Service: General;  Laterality: Left;   BREAST BIOPSY Left 04/01/2023   u/s bx, COIL clip- [facility]   BREAST BIOPSY Left 03/31/2023   u/s bx axilla-HYDROMARK(coil) METASTATIC   BREAST BIOPSY Left 04/01/2023   [place] LT BREAST BX W LOC DEV 1ST LESION IMG BX SPEC US GUIDE 04/01/2023 [facility]   BREAST BIOPSY Right 04/20/2023   (NOT DONE)    [place] RT BREAST BX W LOC DEV 1ST LESION IMG BX SPEC US GUIDE 04/20/2023 [facility]   BREAST BIOPSY Left 05/03/2023   [place] LT RADIO FREQUENCY TAG LOC US GUIDE 05/03/2023 [facility]   CATARACT EXTRACTION Bilateral    CHOLECYSTECTOMY     COLONOSCOPY  01/27/2021   COLONOSCOPY WITH PROPOFOL N/A 09/19/2015   Procedure: COLONOSCOPY WITH PROPOFOL;  Surgeon: Elnita Maxwell, MD;  Location: [facility] ENDOSCOPY;  Service: Endoscopy;  Laterality: N/A;   TOTAL MASTECTOMY Left 05/05/2023   Procedure: TOTAL MASTECTOMY w/ RF tag breast and axillary;  Surgeon: Sung Amabile, DO;  Location: [facility]  ORS;  Service: General;  Laterality: Left;    There were no vitals filed for this visit.        LYMPHEDEMA/ONCOLOGY QUESTIONNAIRE - 11/11/23 0001       Right Upper Extremity Lymphedema   15 cm Proximal to Olecranon Process 31 cm    10 cm Proximal to Olecranon Process 29 cm    Olecranon Process 26.5 cm    15 cm Proximal to Ulnar Styloid Process 25.5 cm    10 cm Proximal to Ulnar Styloid Process 22 cm    Just Proximal to Ulnar Styloid Process 18.2 cm    Across Hand at [facility] 22.5 cm      Left Upper Extremity Lymphedema   15 cm Proximal to Olecranon Process 30 cm    10 cm Proximal to Olecranon Process 27.5 cm    Olecranon Process 26.4 cm    15 cm Proximal to Ulnar Styloid Process 24.2 cm    10 cm Proximal to Ulnar Styloid Process 21.5 cm    Just Proximal to Ulnar Styloid Process 18.2 cm    Across Hand at [facility] 23 cm              OT SCREEN 07/20/23: Pt follow up with OT about 1/2 ways thru radiation . AROM  in L shoulder WNL - some tightness in chest -  but had seroma drained yesterday Remind pt again to cont with doing AAROM for shoulder flexion and ABD in shower in the am to maintain his flexibility thru out radiation. As well as scapula retraction for posture few times during the day Circumference in bilateral UE compare to each other WNL  Pt to follow up after radiation again        OT SCREEN 08/17/23: Patient arrived for follow-up after finishing radiation.  Patient arrived with increased swelling in the wrist and hand on the left.  Patient report increased aroma again since last drain in by Dr. Tonna Boehringer.  Not as tight and large this time.  Planning to contact Dr. Tonna Boehringer to see if needs to be drained. Left upper extremity circumference measurements increased.  Patient report hand and wrist started swelling the last week or 2. Patient was fitted with a Isotoner glove and a Tubigrip D compression from hand to elbow for a week to wear as much as he  can. Will reassess next week.  Cannot do any manual therapy over radiation area.  Appear patient has some lymphatic cording into the left upper arm into the elbow motion limiting and visible during shoulder abduction. Will follow-up with patient next week    OT SCREEN 08/24/23:    Dr Tonna Boehringer note 07/19/23: Assessment:    Malignant neoplasm of central portion of left breast in male, estrogen receptor positive (CMS/HHS-HCC) [W09.811, Z17.0] S/p rmodified radical mastectomy, left  Plan:    1. Now with seroma. Proceed with bedside US guided drainage as noted below:  Pre-op: post op seroma Post op: same Procedure: needle aspiration of seroma using US guidance  EBL: minimal: Complication: none apparent Description of procedure: after obtaining verbal consent, area sterilized in usual fashion. [place] bedside confirmed homogenous hypoechoic area consistent with seroma. Skin anesthesized and 18gauze needle placed in lateral aspect of seroma. Immediate drainage of thin, straw colored fluid noted and drained, approximately . [place] again used at end to ensure majority of fluid drained, scant area noted on [place] was reached with needle under direct visualization but no further fluid able to be aspirated and drained. Needle removed and area covered with 4x4 gauze and tape. Pt tolerated procedure well.  F/u prn if recurrence, which is at high risk due to size of initial seroma. Encouraged using ACE wrap to keep pressure around area. All questions addressed         OT SCREEN 07/20/23: Pt follow up with OT about 1/2 ways thru radiation . AROM  in L shoulder WNL - some tightness in chest - but had seroma drained yesterday Remind pt again to cont with doing AAROM for shoulder flexion and ABD in shower in the am to maintain his flexibility thru out radiation. As well as scapula retraction for posture few times during the day Circumference in bilateral UE compare to each other WNL  Pt to follow up after  radiation again        OT SCREEN 08/17/23: Patient arrived for follow-up after finishing radiation.  Patient arrived with increased swelling in the wrist and hand on the left.  Patient report increased aroma again since last drain in by Dr. Tonna Boehringer.  Not as tight and large this time.  Planning to contact Dr. Tonna Boehringer to see if needs to be drained. Left upper extremity circumference measurements increased.  Patient report hand and wrist started swelling the last week or 2. Patient was fitted with a Isotoner glove and a Tubigrip D compression from hand  to elbow for a week to wear as much as he can. Will reassess next week.  Cannot do any manual therapy over radiation area.  Appear patient has some lymphatic cording into the left upper arm into the elbow motion limiting and visible during shoulder abduction. Will follow-up with patient next week    OT SCREEN 08/24/23:    Pt follow up with OT  after L UE circumference was increase last week and had seroma again - radiation was done.  Pt had seroma drained yesterday by DR Tonna Boehringer. Remind pt again to cont with doing AAROM for shoulder flexion and ABD - pt has some cording in L axilla into upper arm - I attempted to release in upper arm - was able to get 2 small releases- but ed pt on applying traction on chest and do shoulder ABD against traction of skin at chest - at scar tissue. OT cannot do any manual therapy over area of radiation until 6 weeks post last session.   Since seroma drained and compression last week applied to hand and forearm left upper extremity circumference decreased.  Recommend for patient to get a unilateral postmastectomy breast pad to wear with Ace wrap on left thoracic to decrease return of seroma as well as clearing thoracic for left upper extremity drain - wear as much as he can.  Also to continue compression glove . And follow up in 4 wks        OT SCREEN 09/21/23:   Dr Tonna Boehringer note 07/19/23: Assessment:    Malignant neoplasm of  central portion of left breast in male, estrogen receptor positive (CMS/HHS-HCC) [N56.213, Z17.0] S/p rmodified radical mastectomy, left  Plan:    1. Now with seroma. Proceed with bedside US guided drainage as noted below:  Pre-op: post op seroma Post op: same Procedure: needle aspiration of seroma using US guidance  EBL: minimal: Complication: none apparent Description of procedure: after obtaining verbal consent, area sterilized in usual fashion. [place] bedside confirmed homogenous hypoechoic area consistent with seroma. Skin anesthesized and 18gauze needle placed in lateral aspect of seroma. Immediate drainage of thin, straw colored fluid noted and drained, approximately . [place] again used at end to ensure majority of fluid drained, scant area noted on [place] was reached with needle under direct visualization but no further fluid able to be aspirated and drained. Needle removed and area covered with 4x4 gauze and tape. Pt tolerated procedure well.  F/u prn if recurrence, which is at high risk due to size of initial seroma. Encouraged using ACE wrap to keep pressure around area. All questions addressed         OT SCREEN 07/20/23: Pt follow up with OT about 1/2 ways thru radiation . AROM  in L shoulder WNL - some tightness in chest - but had seroma drained yesterday Remind pt again to cont with doing AAROM for shoulder flexion and ABD in shower in the am to maintain his flexibility thru out radiation. As well as scapula retraction for posture few times during the day Circumference in bilateral UE compare to each other WNL  Pt to follow up after radiation again        OT SCREEN 08/17/23: Patient arrived for follow-up after finishing radiation.  Patient arrived with increased swelling in the wrist and hand on the left.  Patient report increased aroma again since last drain in by Dr. Tonna Boehringer.  Not as tight and large this time.  Planning to contact Dr. Tonna Boehringer to see if needs  to be drained. Left  upper extremity circumference measurements increased.  Patient report hand and wrist started swelling the last week or 2. Patient was fitted with a Isotoner glove and a Tubigrip D compression from hand to elbow for a week to wear as much as he can. Will reassess next week.  Cannot do any manual therapy over radiation area.  Appear patient has some lymphatic cording into the left upper arm into the elbow motion limiting and visible during shoulder abduction. Will follow-up with patient next week    OT SCREEN 08/24/23:    Pt follow up with OT  after L UE circumference was increase  about 5 wks ago - and then seen about month ago - Pt had seroma drained  about 5 wks ago  by DR Tonna Boehringer.    Since seroma drained and compression that week to hand and forearm left upper extremity circumference decreased.  Cont to stay decrease since month ago  Did recommend for patient to get a unilateral postmastectomy breast pad 4 wks ago to wear with Ace wrap on left thoracic to decrease return of seroma as well as clearing thoracic for left upper extremity drain - wear as much as he can. He did wear it for about 2 wks - but stop wearing it the last 2 wks Pt appear to have some fluid on chest wall again on L  Consult with breast Navigator - Recommend for pt to contact Dr Tonna Boehringer again - and if drain - pt to wear jovipak compression for 4 wks  And follow up with me again 3 wks after seen Dr Tonna Boehringer       10/04/23 Dr Maia Plan  IMPRESSION:   Postoperative seroma of subcutaneous tissue after non-dermatologic procedure [L76.34] -Today I proceeded with ultrasound-guided percutaneous drainage of the left chest seroma. -Patient will continue occupational therapy -Patient will continue care by his oncologist and Dr. Tonna Boehringer  Pre-op diagnosis: Left chest seroma Postop diagnosis: Same  Indication: Recurrent seroma of the left chest interfering with occupational therapy  Procedure: Patient was placed in supine position. The left  chest was prepped and draped with chlorhexidine. Using ultrasound guided needle was inserted into the seroma cavity. 70 cc of serous fluid was aspirated. Needle removed. No complications. No bleeding patient tolerated the procedure well.  PLAN:  1. Continue conventional therapy 2. Contact us if there is any concern with recurrent seroma      OT SCREEN 10/12/23:   Pt follow up with OT  after - Pt had seroma drained  about 1  wk ago  by Dr Maia Plan    Since seroma drained the previous time  and wore compression 2 wks only  and then seroma drained again last week his L UE  circumference  is decreased.  Cont to stay decrease since  1-2 months ago  Encourage pt to cont to wear unilateral postmastectomy breast pad  the next 3-4 wks since seroma was drained  with Ace wrap on left thoracic to decrease return of seroma as well as clearing thoracic for left upper extremity to drain - wear as much as he can. Before pt wore it for 2 wks - but stop wearing it and seroma returned And follow up with me again 3-5 wks   OT SCREEN 11/11/23: Pt follow up with OT  after  seroma drained  about 5  wk ago  by Dr Maia Plan    Since seroma drained the previous time he wore compression 2 wks only  and it returned - this time wore  unilateral postmastectomy breast pad  5 wks 5-7 days a week for 4-5 hrs on left thoracic to decrease return of seroma as well as clearing thoracic for left upper extremity to drain  AROM in L shoulder doing well - pt is doing some ROM HEP  Pt to cont to wear if feeling full feeling in thoracic to prevent L UE lymphedema Hand and wrist cont to be increase - pt to wear isotoner glove   Pt report hard for him to start exercise program - know he needs to - discuss CARE program - pt agree for referral to be put in  As well as got package for Cancer transition-  Pt to contact if need follow up                                      Visit Diagnosis: Seroma of  breast    Problem List Patient Active Problem List   Diagnosis Date Noted   Breast cancer (HCC) 05/05/2023   Genetic testing 04/26/2023   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, [place], [address] Phone: [phone]   Fax:  [phone]  Name: ROLLIE CILLO MRN: [id number] Date of Birth: [birth year]

## 2023-12-06 ENCOUNTER — Encounter: Payer: Medicare Other | Attending: Oncology

## 2023-12-06 VITALS — Ht 70.2 in | Wt 202.3 lb

## 2023-12-06 DIAGNOSIS — C50922 Malignant neoplasm of unspecified site of left male breast: Secondary | ICD-10-CM

## 2023-12-06 NOTE — Patient Instructions (Signed)
Patient Instructions  Patient Details  Name: Juan Calhoun MRN: [id number] Date of Birth: 1941-04-01 Referring Provider:  Jeralyn Ruths, MD  Below are your personal goals for exercise, nutrition, and risk factors. Our goal is to help you stay on track towards obtaining and maintaining these goals. We will be discussing your progress on these goals with you throughout the program.  Initial Exercise Prescription:  Initial Exercise Prescription - 12/06/23 1400       Date of Initial Exercise RX and Referring Provider   Date 12/06/23    Referring Provider Gerarda Fraction, MD      Oxygen   Maintain Oxygen Saturation 88% or higher      Treadmill   MPH 1.6    Grade 0    Minutes 15    METs 2.23      Recumbant Bike   Level 2    RPM 50    Watts 15    Minutes 15    METs 2.2      NuStep   Level 2    SPM 80    Minutes 15    METs 2.2      REL-XR   Level 2    Speed 50    Minutes 15    METs 2.2      T5 Nustep   Level 2   T6   SPM 80    Minutes 15    METs 2.2      Prescription Details   Frequency (times per week) 2    Duration Progress to 30 minutes of continuous aerobic without signs/symptoms of physical distress      Intensity   THRR 40-80% of Max Heartrate 100-127    Ratings of Perceived Exertion 11-13    Perceived Dyspnea 0-4      Progression   Progression Continue to progress workloads to maintain intensity without signs/symptoms of physical distress.      Resistance Training   Training Prescription Yes    Weight 3 lb    Reps 10-15             Exercise Goals: Frequency: Be able to perform aerobic exercise two to three times per week in program working toward 2-5 days per week of home exercise.  Intensity: Work with a perceived exertion of 11 (fairly light) - 15 (hard) while following your exercise prescription.  We will make changes to your prescription with you as you progress through the program.   Duration: Be able to do 30 to 45 minutes  of continuous aerobic exercise in addition to a 5 minute warm-up and a 5 minute cool-down routine.   Nutrition Goals: Your personal nutrition goals will be established when you do your nutrition analysis with the dietician.  The following are general nutrition guidelines to follow: Cholesterol < 200mg /day Sodium < 1500mg /day Fiber: Men over 50 yrs - 30 grams per day  Exercise Goals and Review:  Exercise Goals     Row Name 12/06/23 1430             Exercise Goals   Increase Physical Activity Yes       Intervention Provide advice, education, support and counseling about physical activity/exercise needs.;Develop an individualized exercise prescription for aerobic and resistive training based on initial evaluation findings, risk stratification, comorbidities and participant's personal goals.       Expected Outcomes Short Term: Attend rehab on a regular basis to increase amount of physical activity.;Long Term: Add in home exercise to  make exercise part of routine and to increase amount of physical activity.;Long Term: Exercising regularly at least 3-5 days a week.       Increase Strength and Stamina Yes       Intervention Provide advice, education, support and counseling about physical activity/exercise needs.;Develop an individualized exercise prescription for aerobic and resistive training based on initial evaluation findings, risk stratification, comorbidities and participant's personal goals.       Expected Outcomes Short Term: Increase workloads from initial exercise prescription for resistance, speed, and METs.;Short Term: Perform resistance training exercises routinely during rehab and add in resistance training at home;Long Term: Improve cardiorespiratory fitness, muscular endurance and strength as measured by increased METs and functional capacity ( )       Able to understand and use rate of perceived exertion (RPE) scale Yes       Intervention Provide education and explanation on  how to use RPE scale       Expected Outcomes Short Term: Able to use RPE daily in rehab to express subjective intensity level;Long Term:  Able to use RPE to guide intensity level when exercising independently       Able to understand and use Dyspnea scale Yes       Intervention Provide education and explanation on how to use Dyspnea scale       Expected Outcomes Short Term: Able to use Dyspnea scale daily in rehab to express subjective sense of shortness of breath during exertion;Long Term: Able to use Dyspnea scale to guide intensity level when exercising independently       Knowledge and understanding of Target Heart Rate Range (THRR) Yes       Intervention Provide education and explanation of THRR including how the numbers were predicted and where they are located for reference       Expected Outcomes Short Term: Able to state/look up THRR;Short Term: Able to use daily as guideline for intensity in rehab;Long Term: Able to use THRR to govern intensity when exercising independently       Able to check pulse independently Yes       Intervention Provide education and demonstration on how to check pulse in carotid and radial arteries.;Review the importance of being able to check your own pulse for safety during independent exercise       Expected Outcomes Short Term: Able to explain why pulse checking is important during independent exercise;Long Term: Able to check pulse independently and accurately       Understanding of Exercise Prescription Yes       Intervention Provide education, explanation, and written materials on patient's individual exercise prescription       Expected Outcomes Short Term: Able to explain program exercise prescription;Long Term: Able to explain home exercise prescription to exercise independently

## 2023-12-06 NOTE — Progress Notes (Signed)
Daily Session Note  Patient Details  Name: Juan Calhoun MRN: [id number] Date of Birth: 24-Mar-1941 Referring Provider:   Doristine Devoid [facility] Exercise from 12/06/2023 in [facility]  Referring Provider Gerarda Fraction, MD       Encounter Date: 12/06/2023  Check In:  Session Check In - 12/06/23 1354       Check-In   Supervising physician immediately available to respond to emergencies See telemetry face sheet for immediately available ER MD    Location [facility]    Staff Present Rory Percy, MS, Exercise Physiologist;Noah Tickle, BS, Exercise Physiologist    Virtual Visit No    Medication changes reported     No    Fall or balance concerns reported    No    Warm-up and Cool-down Performed on first and last piece of equipment   and first day   Resistance Training Performed Yes    VAD Patient? No    PAD/SET Patient? No      Pain Assessment   Currently in Pain? No/denies    Multiple Pain Sites No               Exercise Prescription Changes - 12/06/23 1400       Response to Exercise   Blood Pressure (Admit) 110/64    Blood Pressure (Exercise) 136/62    Blood Pressure (Exit) 102/58    Heart Rate (Admit) 73 bpm    Heart Rate (Exercise) 97 bpm    Heart Rate (Exit) 85 bpm    Oxygen Saturation (Admit) 95 %    Oxygen Saturation (Exercise) 91 %    Oxygen Saturation (Exit) 98 %    Rating of Perceived Exertion (Exercise) 11    Perceived Dyspnea (Exercise) 0    Symptoms none    Comments and 1st day    Duration Progress to 30 minutes of  aerobic without signs/symptoms of physical distress    Intensity THRR New      Progression   Progression Continue to progress workloads to maintain intensity without signs/symptoms of physical distress.    Average METs 2.2             Social History   Tobacco Use  Smoking Status Former   Current packs/day: 0.00   Types: Cigarettes   Quit  date: 1986   Years since quitting: 38.9  Smokeless Tobacco Never    Goals Met:  Independence with exercise equipment Exercise tolerated well No report of concerns or symptoms today  Goals Unmet:  Not Applicable  Comments: First full day of exercise!  Patient was oriented to gym and equipment including functions, settings, policies, and procedures.  Patient's individual exercise prescription and treatment plan were reviewed.  All starting workloads were established based on the results of the 6 minute walk test done at initial orientation visit.  The plan for exercise progression was also introduced and progression will be customized based on patient's performance and goals.   6 Minute Walk     Row Name 12/06/23 1419         6 Minute Walk   Phase Initial     Distance 1200 feet     Walk Time 6 minutes     # of Rest Breaks 0     MPH 2.27     METS 2.2     RPE 11     Perceived Dyspnea  0     VO2 Peak  7.71     Symptoms No     Resting HR 73 bpm     Resting BP 110/64     Resting Oxygen Saturation  95 %     Exercise Oxygen Saturation  during 6 min walk 91 %     Max Ex. HR 97 bpm     Max Ex. BP 136/62     2 Minute Post BP 102/58                Dr. Bethann Punches is Medical Director for [facility].  Dr. Vida Rigger is Medical Director for [facility].

## 2023-12-08 DIAGNOSIS — C50922 Malignant neoplasm of unspecified site of left male breast: Secondary | ICD-10-CM

## 2023-12-08 LAB — GLUCOSE, CAPILLARY
Glucose-Capillary: 171 mg/dL — ABNORMAL HIGH (ref 70–99)
Glucose-Capillary: 114 mg/dL — ABNORMAL HIGH (ref 70–99)

## 2023-12-08 NOTE — Progress Notes (Signed)
Daily Session Note  Patient Details  Name: Juan Calhoun MRN: [id number] Date of Birth: 1941-05-31 Referring Provider:   Doristine Devoid Cancer Associated Rehabilitation & Exercise from 12/06/2023 in [facility] Cardiac and Pulmonary Rehab  Referring Provider Gerarda Fraction, MD       Encounter Date: 12/08/2023  Check In:  Session Check In - 12/08/23 1245       Check-In   Supervising physician immediately available to respond to emergencies See telemetry face sheet for immediately available ER MD    Location [facility]-Cardiac & Pulmonary Rehab    Staff Present Rory Percy, MS, Exercise Physiologist;Maxon Conetta BS, , Exercise Physiologist    Virtual Visit No    Medication changes reported     No    Fall or balance concerns reported    No    Warm-up and Cool-down Performed on first and last piece of equipment    Resistance Training Performed Yes    VAD Patient? No    PAD/SET Patient? No      Pain Assessment   Currently in Pain? No/denies    Multiple Pain Sites No                Social History   Tobacco Use  Smoking Status Former   Current packs/day: 0.00   Types: Cigarettes   Quit date: 1986   Years since quitting: 38.9  Smokeless Tobacco Never    Goals Met:  Independence with exercise equipment Exercise tolerated well No report of concerns or symptoms today  Goals Unmet:  Not Applicable  Comments: Pt able to follow exercise prescription today without complaint.  Will continue to monitor for progression.    Dr. Bethann Punches is Medical Director for [facility] Cardiac Rehabilitation.  Dr. Vida Rigger is Medical Director for [facility] Pulmonary Rehabilitation.

## 2023-12-13 DIAGNOSIS — C50922 Malignant neoplasm of unspecified site of left male breast: Secondary | ICD-10-CM

## 2023-12-13 LAB — GLUCOSE, CAPILLARY
Glucose-Capillary: 139 mg/dL — ABNORMAL HIGH (ref 70–99)
Glucose-Capillary: 92 mg/dL (ref 70–99)

## 2023-12-13 NOTE — Progress Notes (Signed)
Daily Session Note  Patient Details  Name: GLENWOOD PENDELTON MRN: [id number] Date of Birth: 1941/08/29 Referring Provider:   Doristine Devoid Cancer Associated Rehabilitation & Exercise from 12/06/2023 in [facility] Cardiac and Pulmonary Rehab  Referring Provider Gerarda Fraction, MD       Encounter Date: 12/13/2023  Check In:  Session Check In - 12/13/23 1252       Check-In   Supervising physician immediately available to respond to emergencies See telemetry face sheet for immediately available ER MD    Location [facility]-Cardiac & Pulmonary Rehab    Staff Present Rory Percy, MS, Exercise Physiologist;Safiyya Stokes, BS, Exercise Physiologist    Virtual Visit No    Medication changes reported     No    Fall or balance concerns reported    No    Warm-up and Cool-down Performed on first and last piece of equipment    Resistance Training Performed Yes    VAD Patient? No    PAD/SET Patient? No      Pain Assessment   Currently in Pain? No/denies    Multiple Pain Sites No                Social History   Tobacco Use  Smoking Status Former   Current packs/day: 0.00   Types: Cigarettes   Quit date: 1986   Years since quitting: 38.9  Smokeless Tobacco Never    Goals Met:  Independence with exercise equipment Exercise tolerated well No report of concerns or symptoms today  Goals Unmet:  Not Applicable  Comments: Pt able to follow exercise prescription today without complaint.  Will continue to monitor for progression.    Dr. Bethann Punches is Medical Director for [facility] Cardiac Rehabilitation.  Dr. Vida Rigger is Medical Director for [facility] Pulmonary Rehabilitation.

## 2023-12-15 DIAGNOSIS — C50922 Malignant neoplasm of unspecified site of left male breast: Secondary | ICD-10-CM

## 2023-12-15 NOTE — Progress Notes (Signed)
Daily Session Note  Patient Details  Name: Juan Calhoun MRN: [id number] Date of Birth: [birth year] Referring Provider:   Doristine Devoid Cancer Associated Rehabilitation & Exercise from 12/06/2023 in [facility] Cardiac and Pulmonary Rehab  Referring Provider Gerarda Fraction, MD       Encounter Date: 12/15/2023  Check In:  Session Check In - 12/15/23 1234       Check-In   Supervising physician immediately available to respond to emergencies See telemetry face sheet for immediately available ER MD    Location [facility]Cardiac & Pulmonary Rehab    Staff Present Rory Percy, MS, Exercise Physiologist;Maxon Conetta BS, , Exercise Physiologist    Virtual Visit No    Medication changes reported     No    Fall or balance concerns reported    No    Warm-up and Cool-down Performed on first and last piece of equipment    Resistance Training Performed Yes    VAD Patient? No    PAD/SET Patient? No      Pain Assessment   Currently in Pain? No/denies    Multiple Pain Sites No                Social History   Tobacco Use  Smoking Status Former   Current packs/day: 0.00   Types: Cigarettes   Quit date: 1986   Years since quitting: 38.9  Smokeless Tobacco Never    Goals Met:  Independence with exercise equipment Exercise tolerated well No report of concerns or symptoms today  Goals Unmet:  Not Applicable  Comments: Pt able to follow exercise prescription today without complaint.  Will continue to monitor for progression.    Dr. Bethann Punches is Medical Director for [facility] Cardiac Rehabilitation.  Dr. Vida Rigger is Medical Director for [facility] Pulmonary Rehabilitation.

## 2024-01-03 ENCOUNTER — Encounter: Payer: Medicare Other | Attending: Oncology | Admitting: *Deleted

## 2024-01-03 DIAGNOSIS — C50922 Malignant neoplasm of unspecified site of left male breast: Secondary | ICD-10-CM | POA: Insufficient documentation

## 2024-01-03 NOTE — Progress Notes (Signed)
 Daily Session Note  Patient Details  Name: Juan Calhoun MRN: [id number] Date of Birth: March 02, 1941 Referring Provider:   Conrad Ports Cancer Associated Rehabilitation & Exercise from 12/06/2023 in [facility] Cardiac and Pulmonary Rehab  Referring Provider Jacobo Lye, MD       Encounter Date: 01/03/2024  Check In:  Session Check In - 01/03/24 1215       Check-In   Supervising physician immediately available to respond to emergencies See telemetry face sheet for immediately available ER MD    Location [facility]Cardiac & Pulmonary Rehab    Staff Present Hoy Rodney, RN BSN;Margaret Best, MS, Exercise Physiologist;Noah Tickle, BS, Exercise Physiologist    Virtual Visit No    Medication changes reported     No    Fall or balance concerns reported    No    Warm-up and Cool-down Performed on first and last piece of equipment    Resistance Training Performed Yes    VAD Patient? No    PAD/SET Patient? No      Pain Assessment   Currently in Pain? No/denies                Social History   Tobacco Use  Smoking Status Former   Current packs/day: 0.00   Types: Cigarettes   Quit date: 1986   Years since quitting: 39.0  Smokeless Tobacco Never    Goals Met:  Independence with exercise equipment Exercise tolerated well No report of concerns or symptoms today Strength training completed today  Goals Unmet:  Not Applicable  Comments: Pt able to follow exercise prescription today without complaint.  Will continue to monitor for progression.    Dr. Oneil Pinal is Medical Director for [facility] Cardiac Rehabilitation.  Dr. Fuad Aleskerov is Medical Director for [facility] Pulmonary Rehabilitation.

## 2024-01-05 ENCOUNTER — Encounter: Payer: Medicare Other | Admitting: *Deleted

## 2024-01-05 DIAGNOSIS — C50922 Malignant neoplasm of unspecified site of left male breast: Secondary | ICD-10-CM

## 2024-01-05 NOTE — Progress Notes (Signed)
 Daily Session Note  Patient Details  Name: KENGO STURGES MRN: [id number] Date of Birth: 1941/12/12 Referring Provider:   Conrad Ports Cancer Associated Rehabilitation & Exercise from 12/06/2023 in [facility] Cardiac and Pulmonary Rehab  Referring Provider Jacobo Lye, MD       Encounter Date: 01/05/2024  Check In:  Session Check In - 01/05/24 1357       Check-In   Supervising physician immediately available to respond to emergencies See telemetry face sheet for immediately available ER MD    Location [facility]-Cardiac & Pulmonary Rehab    Staff Present Maxon Conetta BS, , Exercise Physiologist;Antonetta Clanton Claudene, RN, ADN    Virtual Visit No    Medication changes reported     No    Fall or balance concerns reported    No    Warm-up and Cool-down Performed on first and last piece of equipment    Resistance Training Performed Yes    VAD Patient? No    PAD/SET Patient? No      Pain Assessment   Currently in Pain? No/denies                Social History   Tobacco Use  Smoking Status Former   Current packs/day: 0.00   Types: Cigarettes   Quit date: 1986   Years since quitting: 39.0  Smokeless Tobacco Never    Goals Met:  Independence with exercise equipment Exercise tolerated well No report of concerns or symptoms today Strength training completed today  Goals Unmet:  Not Applicable  Comments: Pt able to follow exercise prescription today without complaint.  Will continue to monitor for progression.    Dr. Oneil Pinal is Medical Director for [facility] Cardiac Rehabilitation.  Dr. Fuad Aleskerov is Medical Director for [facility] Pulmonary Rehabilitation.

## 2024-01-10 ENCOUNTER — Encounter: Payer: Medicare Other | Admitting: *Deleted

## 2024-01-10 DIAGNOSIS — C50922 Malignant neoplasm of unspecified site of left male breast: Secondary | ICD-10-CM

## 2024-01-10 NOTE — Progress Notes (Signed)
 Daily Session Note  Patient Details  Name: KEREM GILMER MRN: [id number] Date of Birth: Jul 18, 1941 Referring Provider:   Conrad Ports Cancer Associated Rehabilitation & Exercise from 12/06/2023 in [facility] Cardiac and Pulmonary Rehab  Referring Provider Jacobo Lye, MD       Encounter Date: 01/10/2024  Check In:  Session Check In - 01/10/24 1226       Check-In   Supervising physician immediately available to respond to emergencies See telemetry face sheet for immediately available ER MD    Location [facility]-Cardiac & Pulmonary Rehab    Staff Present Rollene Paterson, MS, Exercise Physiologist;Cala Kruckenberg Jacques RN, BSN    Virtual Visit No    Medication changes reported     No    Fall or balance concerns reported    No    Tobacco Cessation No Change    Warm-up and Cool-down Performed on first and last piece of equipment    Resistance Training Performed Yes    VAD Patient? No    PAD/SET Patient? No      Pain Assessment   Currently in Pain? No/denies                Social History   Tobacco Use  Smoking Status Former   Current packs/day: 0.00   Types: Cigarettes   Quit date: 1986   Years since quitting: 39.0  Smokeless Tobacco Never    Goals Met:  Proper associated with RPD/PD & O2 Sat Independence with exercise equipment Exercise tolerated well No report of concerns or symptoms today Strength training completed today  Goals Unmet:  Not Applicable  Comments: Pt able to follow exercise prescription today without complaint.  Will continue to monitor for progression.    Dr. Oneil Pinal is Medical Director for [facility] Cardiac Rehabilitation.  Dr. Fuad Aleskerov is Medical Director for [facility] Pulmonary Rehabilitation.

## 2024-01-12 DIAGNOSIS — C50922 Malignant neoplasm of unspecified site of left male breast: Secondary | ICD-10-CM

## 2024-01-12 NOTE — Progress Notes (Signed)
Daily Session Note  Patient Details  Name: AYDREN REGISTER MRN: [id number] Date of Birth: 02/10/1941 Referring Provider:   Doristine Devoid Cancer Associated Rehabilitation & Exercise from 12/06/2023 in [facility] Cardiac and Pulmonary Rehab  Referring Provider Gerarda Fraction, MD       Encounter Date: 01/12/2024  Check In:  Session Check In - 01/12/24 1307       Check-In   Supervising physician immediately available to respond to emergencies See telemetry face sheet for immediately available ER MD    Location [facility]-Cardiac & Pulmonary Rehab    Staff Present Rory Percy, MS, Exercise Physiologist;Maxon Conetta BS, Exercise Physiologist    Virtual Visit No    Medication changes reported     No    Fall or balance concerns reported    No    Warm-up and Cool-down Performed on first and last piece of equipment    Resistance Training Performed Yes    VAD Patient? No    PAD/SET Patient? No      Pain Assessment   Currently in Pain? No/denies    Multiple Pain Sites No                Social History   Tobacco Use  Smoking Status Former   Current packs/day: 0.00   Types: Cigarettes   Quit date: 1986   Years since quitting: 39.0  Smokeless Tobacco Never    Goals Met:  Independence with exercise equipment Exercise tolerated well No report of concerns or symptoms today  Goals Unmet:  Not Applicable  Comments: Pt able to follow exercise prescription today without complaint.  Will continue to monitor for progression.    Dr. Bethann Punches is Medical Director for [facility] Cardiac Rehabilitation.  Dr. Vida Rigger is Medical Director for [facility] Pulmonary Rehabilitation.

## 2024-01-17 DIAGNOSIS — C50922 Malignant neoplasm of unspecified site of left male breast: Secondary | ICD-10-CM

## 2024-01-17 NOTE — Progress Notes (Signed)
Daily Session Note  Patient Details  Name: Juan Calhoun MRN: [id number] Date of Birth: 12-Dec-1941 Referring Provider:   Doristine Devoid Cancer Associated Rehabilitation & Exercise from 12/06/2023 in [facility] Cardiac and Pulmonary Rehab  Referring Provider Gerarda Fraction, MD       Encounter Date: 01/17/2024  Check In:  Session Check In - 01/17/24 1216       Check-In   Supervising physician immediately available to respond to emergencies See telemetry face sheet for immediately available ER MD    Location [facility]-Cardiac & Pulmonary Rehab    Staff Present Rory Percy, MS, Exercise Physiologist;Noah Tickle, BS, Exercise Physiologist    Virtual Visit No    Medication changes reported     No    Fall or balance concerns reported    No    Warm-up and Cool-down Performed on first and last piece of equipment    Resistance Training Performed Yes    VAD Patient? No    PAD/SET Patient? No      Pain Assessment   Currently in Pain? No/denies    Multiple Pain Sites No                Social History   Tobacco Use  Smoking Status Former   Current packs/day: 0.00   Types: Cigarettes   Quit date: 1986   Years since quitting: 39.0  Smokeless Tobacco Never    Goals Met:  Independence with exercise equipment Exercise tolerated well No report of concerns or symptoms today  Goals Unmet:  Not Applicable  Comments: Pt able to follow exercise prescription today without complaint.  Will continue to monitor for progression.    Dr. Bethann Punches is Medical Director for [facility] Cardiac Rehabilitation.  Dr. Vida Rigger is Medical Director for [facility] Pulmonary Rehabilitation.

## 2024-01-19 DIAGNOSIS — C50922 Malignant neoplasm of unspecified site of left male breast: Secondary | ICD-10-CM

## 2024-01-19 NOTE — Progress Notes (Signed)
Daily Session Note  Patient Details  Name: DAYLYNN RICHIE MRN: [id number] Date of Birth: 10/23/1941 Referring Provider:   Doristine Devoid Cancer Associated Rehabilitation & Exercise from 12/06/2023 in [facility] Cardiac and Pulmonary Rehab  Referring Provider Gerarda Fraction, MD       Encounter Date: 01/19/2024  Check In:  Session Check In - 01/19/24 1228       Check-In   Supervising physician immediately available to respond to emergencies See telemetry face sheet for immediately available ER MD    Location [facility]-Cardiac & Pulmonary Rehab    Staff Present Rory Percy, MS, Exercise Physiologist    Virtual Visit No    Medication changes reported     No    Fall or balance concerns reported    No    Warm-up and Cool-down Performed on first and last piece of equipment    Resistance Training Performed Yes    VAD Patient? No    PAD/SET Patient? No      Pain Assessment   Currently in Pain? No/denies    Multiple Pain Sites No                Social History   Tobacco Use  Smoking Status Former   Current packs/day: 0.00   Types: Cigarettes   Quit date: 1986   Years since quitting: 39.0  Smokeless Tobacco Never    Goals Met:  Independence with exercise equipment Exercise tolerated well No report of concerns or symptoms today  Goals Unmet:  Not Applicable  Comments: Pt able to follow exercise prescription today without complaint.  Will continue to monitor for progression.    Dr. Bethann Punches is Medical Director for [facility] Cardiac Rehabilitation.  Dr. Vida Rigger is Medical Director for [facility] Pulmonary Rehabilitation.

## 2024-01-24 DIAGNOSIS — C50922 Malignant neoplasm of unspecified site of left male breast: Secondary | ICD-10-CM

## 2024-01-24 NOTE — Progress Notes (Signed)
Daily Session Note  Patient Details  Name: Juan Calhoun MRN: [id number] Date of Birth: 19-Mar-1941 Referring Provider:   Doristine Devoid Cancer Associated Rehabilitation & Exercise from 12/06/2023 in [facility] Cardiac and Pulmonary Rehab  Referring Provider Gerarda Fraction, MD       Encounter Date: 01/24/2024  Check In:  Session Check In - 01/24/24 1215       Check-In   Supervising physician immediately available to respond to emergencies See telemetry face sheet for immediately available ER MD    Location [facility]-Cardiac & Pulmonary Rehab    Staff Present Rory Percy, MS, Exercise Physiologist;Noah Tickle, BS, Exercise Physiologist    Virtual Visit No    Medication changes reported     No    Fall or balance concerns reported    No    Warm-up and Cool-down Performed on first and last piece of equipment    Resistance Training Performed Yes    VAD Patient? No    PAD/SET Patient? No      Pain Assessment   Currently in Pain? No/denies    Multiple Pain Sites No                Social History   Tobacco Use  Smoking Status Former   Current packs/day: 0.00   Types: Cigarettes   Quit date: 1986   Years since quitting: 39.1  Smokeless Tobacco Never    Goals Met:  Independence with exercise equipment Exercise tolerated well No report of concerns or symptoms today  Goals Unmet:  Not Applicable  Comments: Pt able to follow exercise prescription today without complaint.  Will continue to monitor for progression.    Dr. Bethann Punches is Medical Director for [facility] Cardiac Rehabilitation.  Dr. Vida Rigger is Medical Director for [facility] Pulmonary Rehabilitation.

## 2024-01-26 DIAGNOSIS — C50922 Malignant neoplasm of unspecified site of left male breast: Secondary | ICD-10-CM

## 2024-01-26 NOTE — Progress Notes (Signed)
Daily Session Note  Patient Details  Name: Juan Calhoun MRN: [id number] Date of Birth: 1941/08/14 Referring Provider:   Doristine Devoid Cancer Associated Rehabilitation & Exercise from 12/06/2023 in [facility] Cardiac and Pulmonary Rehab  Referring Provider Gerarda Fraction, MD       Encounter Date: 01/26/2024  Check In:  Session Check In - 01/26/24 1219       Check-In   Supervising physician immediately available to respond to emergencies See telemetry face sheet for immediately available ER MD    Location [facility]-Cardiac & Pulmonary Rehab    Staff Present Rory Percy, MS, Exercise Physiologist;Maxon Conetta BS, Exercise Physiologist    Virtual Visit No    Medication changes reported     No    Fall or balance concerns reported    No    Warm-up and Cool-down Performed on first and last piece of equipment    Resistance Training Performed Yes    VAD Patient? No    PAD/SET Patient? No      Pain Assessment   Currently in Pain? No/denies    Multiple Pain Sites No                Social History   Tobacco Use  Smoking Status Former   Current packs/day: 0.00   Types: Cigarettes   Quit date: 1986   Years since quitting: 39.1  Smokeless Tobacco Never    Goals Met:  Independence with exercise equipment Exercise tolerated well No report of concerns or symptoms today  Goals Unmet:  Not Applicable  Comments: Pt able to follow exercise prescription today without complaint.  Will continue to monitor for progression.    Dr. Bethann Punches is Medical Director for [facility] Cardiac Rehabilitation.  Dr. Vida Rigger is Medical Director for [facility] Pulmonary Rehabilitation.

## 2024-01-31 ENCOUNTER — Encounter: Payer: Medicare Other | Attending: Oncology

## 2024-01-31 DIAGNOSIS — C50922 Malignant neoplasm of unspecified site of left male breast: Secondary | ICD-10-CM | POA: Insufficient documentation

## 2024-01-31 NOTE — Progress Notes (Signed)
Daily Session Note  Patient Details  Name: Juan Calhoun MRN: [id number] Date of Birth: February 05, 1941 Referring Provider:   Doristine Devoid Cancer Associated Rehabilitation & Exercise from 12/06/2023 in [facility] Cardiac and Pulmonary Rehab  Referring Provider Gerarda Fraction, MD       Encounter Date: 01/31/2024  Check In:  Session Check In - 01/31/24 1228       Check-In   Supervising physician immediately available to respond to emergencies See telemetry face sheet for immediately available ER MD    Location [facility]-Cardiac & Pulmonary Rehab    Staff Present Rory Percy, MS, Exercise Physiologist;Tenesia Escudero, BS, Exercise Physiologist    Virtual Visit No    Medication changes reported     No    Fall or balance concerns reported    No    Warm-up and Cool-down Performed on first and last piece of equipment    Resistance Training Performed Yes    VAD Patient? No    PAD/SET Patient? No      Pain Assessment   Currently in Pain? No/denies    Multiple Pain Sites No                Social History   Tobacco Use  Smoking Status Former   Current packs/day: 0.00   Types: Cigarettes   Quit date: 1986   Years since quitting: 39.1  Smokeless Tobacco Never    Goals Met:  Independence with exercise equipment Exercise tolerated well No report of concerns or symptoms today  Goals Unmet:  Not Applicable  Comments: Pt able to follow exercise prescription today without complaint.  Will continue to monitor for progression.    Dr. Bethann Punches is Medical Director for [facility] Cardiac Rehabilitation.  Dr. Vida Rigger is Medical Director for [facility] Pulmonary Rehabilitation.

## 2024-02-02 DIAGNOSIS — C50922 Malignant neoplasm of unspecified site of left male breast: Secondary | ICD-10-CM

## 2024-02-02 NOTE — Progress Notes (Signed)
 Daily Session Note  Patient Details  Name: Juan Calhoun MRN: [id number] Date of Birth: 1941-08-15 Referring Provider:   Conrad Ports Cancer Associated Rehabilitation & Exercise from 12/06/2023 in [facility] Cardiac and Pulmonary Rehab  Referring Provider Jacobo Lye, MD       Encounter Date: 02/02/2024  Check In:  Session Check In - 02/02/24 1339       Check-In   Supervising physician immediately available to respond to emergencies See telemetry face sheet for immediately available ER MD    Location [facility]-Cardiac & Pulmonary Rehab    Staff Present Rollene Paterson, MS, Exercise Physiologist;Maxon Conetta BS, Exercise Physiologist    Virtual Visit No    Medication changes reported     No    Fall or balance concerns reported    No    Warm-up and Cool-down Performed on first and last piece of equipment    Resistance Training Performed Yes    VAD Patient? No    PAD/SET Patient? No      Pain Assessment   Currently in Pain? No/denies    Multiple Pain Sites No                Social History   Tobacco Use  Smoking Status Former   Current packs/day: 0.00   Types: Cigarettes   Quit date: 1986   Years since quitting: 39.1  Smokeless Tobacco Never    Goals Met:  Independence with exercise equipment Exercise tolerated well No report of concerns or symptoms today  Goals Unmet:  Not Applicable  Comments: Pt able to follow exercise prescription today without complaint.  Will continue to monitor for progression.    Dr. Oneil Pinal is Medical Director for [facility] Cardiac Rehabilitation.  Dr. Fuad Aleskerov is Medical Director for [facility] Pulmonary Rehabilitation.

## 2024-02-07 DIAGNOSIS — C50922 Malignant neoplasm of unspecified site of left male breast: Secondary | ICD-10-CM

## 2024-02-07 NOTE — Progress Notes (Signed)
Daily Session Note  Patient Details  Name: WILIAN KWONG MRN: [id number] Date of Birth: [birth year] Referring Provider:   Doristine Devoid Cancer Associated Rehabilitation & Exercise from 12/06/2023 in [facility] Cardiac and Pulmonary Rehab  Referring Provider Gerarda Fraction, MD       Encounter Date: 02/07/2024  Check In:  Session Check In - 02/07/24 1225       Check-In   Supervising physician immediately available to respond to emergencies See telemetry face sheet for immediately available ER MD    Location [facility]-Cardiac & Pulmonary Rehab    Staff Present Rory Percy, MS, Exercise Physiologist;Noah Tickle, BS, Exercise Physiologist    Virtual Visit No    Medication changes reported     No    Fall or balance concerns reported    No    Warm-up and Cool-down Performed on first and last piece of equipment    Resistance Training Performed Yes    VAD Patient? No    PAD/SET Patient? No      Pain Assessment   Currently in Pain? No/denies    Multiple Pain Sites No                Social History   Tobacco Use  Smoking Status Former   Current packs/day: 0.00   Types: Cigarettes   Quit date: 1986   Years since quitting: 39.1  Smokeless Tobacco Never    Goals Met:  Independence with exercise equipment Exercise tolerated well No report of concerns or symptoms today  Goals Unmet:  Not Applicable  Comments: Pt able to follow exercise prescription today without complaint.  Will continue to monitor for progression. Reviewed home exercise with pt today.  Pt plans to use exercise bike at home for aerobic exercise and dumbbells and resistance bands 3 additional days for exercise.  Reviewed THR, pulse, RPE, sign and symptoms, pulse oximetery and when to call 911 or MD.  Also discussed weather considerations and indoor options.  Pt voiced understanding.    Dr. Bethann Punches is Medical Director for [facility] Cardiac Rehabilitation.  Dr. Vida Rigger is Medical Director for  [facility] Pulmonary Rehabilitation.

## 2024-02-09 DIAGNOSIS — C50922 Malignant neoplasm of unspecified site of left male breast: Secondary | ICD-10-CM

## 2024-02-09 NOTE — Progress Notes (Signed)
Daily Session Note  Patient Details  Name: Juan Calhoun MRN: [id number] Date of Birth: 13-Aug-1941 Referring Provider:   Doristine Devoid Cancer Associated Rehabilitation & Exercise from 12/06/2023 in [facility] Cardiac and Pulmonary Rehab  Referring Provider Gerarda Fraction, MD       Encounter Date: 02/09/2024  Check In:  Session Check In - 02/09/24 1311       Check-In   Supervising physician immediately available to respond to emergencies See telemetry face sheet for immediately available ER MD    Location [facility]-Cardiac & Pulmonary Rehab    Staff Present Rory Percy, MS, Exercise Physiologist;Maxon Conetta BS, Exercise Physiologist    Virtual Visit No    Medication changes reported     No    Fall or balance concerns reported    No    Warm-up and Cool-down Performed on first and last piece of equipment    Resistance Training Performed Yes    VAD Patient? No    PAD/SET Patient? No      Pain Assessment   Currently in Pain? No/denies    Multiple Pain Sites No                Social History   Tobacco Use  Smoking Status Former   Current packs/day: 0.00   Types: Cigarettes   Quit date: 1986   Years since quitting: 39.1  Smokeless Tobacco Never    Goals Met:  Independence with exercise equipment Exercise tolerated well No report of concerns or symptoms today  Goals Unmet:  Not Applicable  Comments: Pt able to follow exercise prescription today without complaint.  Will continue to monitor for progression.    Dr. Bethann Punches is Medical Director for [facility] Cardiac Rehabilitation.  Dr. Vida Rigger is Medical Director for [facility] Pulmonary Rehabilitation.

## 2024-02-14 DIAGNOSIS — C50922 Malignant neoplasm of unspecified site of left male breast: Secondary | ICD-10-CM

## 2024-02-14 NOTE — Progress Notes (Signed)
Daily Session Note  Patient Details  Name: Juan Calhoun MRN: [id number] Date of Birth: 10/10/1941 Referring Provider:   Doristine Devoid Cancer Associated Rehabilitation & Exercise from 12/06/2023 in [facility] Cardiac and Pulmonary Rehab  Referring Provider Gerarda Fraction, MD       Encounter Date: 02/14/2024  Check In:  Session Check In - 02/14/24 1229       Check-In   Supervising physician immediately available to respond to emergencies See telemetry face sheet for immediately available ER MD    Location [facility]-Cardiac & Pulmonary Rehab    Staff Present Rory Percy, MS, Exercise Physiologist;Corby Vandenberghe, BS, Exercise Physiologist    Virtual Visit No    Medication changes reported     No    Fall or balance concerns reported    No    Warm-up and Cool-down Performed on first and last piece of equipment    Resistance Training Performed Yes    VAD Patient? No    PAD/SET Patient? No      Pain Assessment   Currently in Pain? No/denies    Multiple Pain Sites No                Social History   Tobacco Use  Smoking Status Former   Current packs/day: 0.00   Types: Cigarettes   Quit date: 1986   Years since quitting: 39.1  Smokeless Tobacco Never    Goals Met:  Independence with exercise equipment Exercise tolerated well No report of concerns or symptoms today  Goals Unmet:  Not Applicable  Comments: Pt able to follow exercise prescription today without complaint.  Will continue to monitor for progression.    Dr. Bethann Punches is Medical Director for [facility] Cardiac Rehabilitation.  Dr. Vida Rigger is Medical Director for [facility] Pulmonary Rehabilitation.

## 2024-02-21 DIAGNOSIS — C50922 Malignant neoplasm of unspecified site of left male breast: Secondary | ICD-10-CM

## 2024-02-21 NOTE — Progress Notes (Signed)
 Daily Session Note  Patient Details  Name: Juan Calhoun MRN: [id number] Date of Birth: [birth year] Referring Provider:   Doristine Devoid Cancer Associated Rehabilitation & Exercise from 12/06/2023 in [facility] Cardiac and Pulmonary Rehab  Referring Provider Gerarda Fraction, MD       Encounter Date: 02/21/2024  Check In:  Session Check In - 02/21/24 1425       Check-In   Supervising physician immediately available to respond to emergencies See telemetry face sheet for immediately available ER MD    Location [facility]-Cardiac & Pulmonary Rehab    Staff Present Rory Percy, MS, Exercise Physiologist;Maxon Conetta BS, Exercise Physiologist    Virtual Visit No    Medication changes reported     No    Fall or balance concerns reported    No    Warm-up and Cool-down Performed on first and last piece of equipment    Resistance Training Performed Yes    VAD Patient? No    PAD/SET Patient? No      Pain Assessment   Currently in Pain? No/denies    Multiple Pain Sites No                Social History   Tobacco Use  Smoking Status Former   Current packs/day: 0.00   Types: Cigarettes   Quit date: 1986   Years since quitting: 39.1  Smokeless Tobacco Never    Goals Met:  Independence with exercise equipment Exercise tolerated well No report of concerns or symptoms today  Goals Unmet:  Not Applicable  Comments: Pt able to follow exercise prescription today without complaint.  Will continue to monitor for progression.    Dr. Bethann Punches is Medical Director for [facility] Cardiac Rehabilitation.  Dr. Vida Rigger is Medical Director for [facility] Pulmonary Rehabilitation.

## 2024-02-23 DIAGNOSIS — C50922 Malignant neoplasm of unspecified site of left male breast: Secondary | ICD-10-CM

## 2024-02-23 NOTE — Progress Notes (Signed)
 Daily Session Note  Patient Details  Name: Juan Calhoun MRN: [id number] Date of Birth: September 05, 1941 Referring Provider:   Doristine Devoid Cancer Associated Rehabilitation & Exercise from 12/06/2023 in [facility] Cardiac and Pulmonary Rehab  Referring Provider Gerarda Fraction, MD       Encounter Date: 02/23/2024  Check In:  Session Check In - 02/23/24 1312       Check-In   Supervising physician immediately available to respond to emergencies See telemetry face sheet for immediately available ER MD    Location [facility]-Cardiac & Pulmonary Rehab    Staff Present Rory Percy, MS, Exercise Physiologist;Johniece Hornbaker, BS, Exercise Physiologist    Virtual Visit No    Medication changes reported     No    Fall or balance concerns reported    No    Warm-up and Cool-down Performed on first and last piece of equipment    Resistance Training Performed Yes    VAD Patient? No    PAD/SET Patient? No      Pain Assessment   Currently in Pain? No/denies    Multiple Pain Sites No                Social History   Tobacco Use  Smoking Status Former   Current packs/day: 0.00   Types: Cigarettes   Quit date: 1986   Years since quitting: 39.1  Smokeless Tobacco Never    Goals Met:  Independence with exercise equipment Exercise tolerated well No report of concerns or symptoms today  Goals Unmet:  Not Applicable  Comments: Pt able to follow exercise prescription today without complaint.  Will continue to monitor for progression.    Dr. Bethann Punches is Medical Director for [facility] Cardiac Rehabilitation.  Dr. Vida Rigger is Medical Director for [facility] Pulmonary Rehabilitation.

## 2024-02-28 ENCOUNTER — Encounter: Payer: Medicare Other | Attending: Oncology

## 2024-02-28 DIAGNOSIS — C50922 Malignant neoplasm of unspecified site of left male breast: Secondary | ICD-10-CM | POA: Insufficient documentation

## 2024-02-28 NOTE — Progress Notes (Signed)
 Daily Session Note  Patient Details  Name: Juan Calhoun MRN: [id number] Date of Birth: 1941-11-23 Referring Provider:   Doristine Devoid Cancer Associated Rehabilitation & Exercise from 12/06/2023 in [facility] Cardiac and Pulmonary Rehab  Referring Provider Gerarda Fraction, MD       Encounter Date: 02/28/2024  Check In:  Session Check In - 02/28/24 1226       Check-In   Supervising physician immediately available to respond to emergencies See telemetry face sheet for immediately available ER MD    Location [facility]-Cardiac & Pulmonary Rehab    Staff Present Rory Percy, MS, Exercise Physiologist;Noah Tickle, BS, Exercise Physiologist    Virtual Visit No    Medication changes reported     No    Fall or balance concerns reported    No    Warm-up and Cool-down Performed on first and last piece of equipment    Resistance Training Performed Yes    VAD Patient? No    PAD/SET Patient? No      Pain Assessment   Currently in Pain? No/denies    Multiple Pain Sites No                Social History   Tobacco Use  Smoking Status Former   Current packs/day: 0.00   Types: Cigarettes   Quit date: 1986   Years since quitting: 39.1  Smokeless Tobacco Never    Goals Met:  Independence with exercise equipment Exercise tolerated well No report of concerns or symptoms today  Goals Unmet:  Not Applicable  Comments: Pt able to follow exercise prescription today without complaint.  Will continue to monitor for progression.    Dr. Bethann Punches is Medical Director for [facility] Cardiac Rehabilitation.  Dr. Vida Rigger is Medical Director for [facility] Pulmonary Rehabilitation.

## 2024-03-01 DIAGNOSIS — C50922 Malignant neoplasm of unspecified site of left male breast: Secondary | ICD-10-CM

## 2024-03-01 NOTE — Progress Notes (Signed)
 Daily Session Note  Patient Details  Name: Juan Calhoun MRN: [id number] Date of Birth: 09/27/1941 Referring Provider:   Doristine Devoid Cancer Associated Rehabilitation & Exercise from 12/06/2023 in [facility] Cardiac and Pulmonary Rehab  Referring Provider Gerarda Fraction, MD       Encounter Date: 03/01/2024  Check In:  Session Check In - 03/01/24 1223       Check-In   Supervising physician immediately available to respond to emergencies See telemetry face sheet for immediately available ER MD    Location [facility]-Cardiac & Pulmonary Rehab    Staff Present Rory Percy, MS, Exercise Physiologist;Noah Tickle, BS, Exercise Physiologist    Virtual Visit No    Medication changes reported     No    Fall or balance concerns reported    No    Warm-up and Cool-down Performed on first and last piece of equipment    Resistance Training Performed Yes    VAD Patient? No    PAD/SET Patient? No      Pain Assessment   Currently in Pain? No/denies    Multiple Pain Sites No                Social History   Tobacco Use  Smoking Status Former   Current packs/day: 0.00   Types: Cigarettes   Quit date: 1986   Years since quitting: 39.2  Smokeless Tobacco Never    Goals Met:  Independence with exercise equipment Exercise tolerated well No report of concerns or symptoms today  Goals Unmet:  Not Applicable  Comments: Pt able to follow exercise prescription today without complaint.  Will continue to monitor for progression.    Dr. Bethann Punches is Medical Director for [facility] Cardiac Rehabilitation.  Dr. Vida Rigger is Medical Director for [facility] Pulmonary Rehabilitation.

## 2024-03-06 ENCOUNTER — Encounter: Payer: Medicare Other | Admitting: *Deleted

## 2024-03-06 DIAGNOSIS — C50922 Malignant neoplasm of unspecified site of left male breast: Secondary | ICD-10-CM

## 2024-03-06 NOTE — Progress Notes (Signed)
 Daily Session Note  Patient Details  Name: Juan Calhoun MRN: [id number] Date of Birth: 1941/07/11 Referring Provider:   Doristine Devoid Cancer Associated Rehabilitation & Exercise from 12/06/2023 in [facility] Cardiac and Pulmonary Rehab  Referring Provider Gerarda Fraction, MD       Encounter Date: 03/06/2024  Check In:  Session Check In - 03/06/24 1221       Check-In   Supervising physician immediately available to respond to emergencies See telemetry face sheet for immediately available ER MD    Location [facility]-Cardiac & Pulmonary Rehab    Staff Present Susann Givens RN,BSN;Margaret Best, MS, Exercise Physiologist    Virtual Visit No    Medication changes reported     No    Fall or balance concerns reported    No    Warm-up and Cool-down Performed on first and last piece of equipment    Resistance Training Performed Yes    VAD Patient? No    PAD/SET Patient? No      Pain Assessment   Currently in Pain? No/denies                Social History   Tobacco Use  Smoking Status Former   Current packs/day: 0.00   Types: Cigarettes   Quit date: 1986   Years since quitting: 39.2  Smokeless Tobacco Never    Goals Met:  Independence with exercise equipment Exercise tolerated well No report of concerns or symptoms today Strength training completed today  Goals Unmet:  Not Applicable  Comments: Pt able to follow exercise prescription today without complaint.  Will continue to monitor for progression.    Dr. Bethann Punches is Medical Director for [facility] Cardiac Rehabilitation.  Dr. Vida Rigger is Medical Director for [facility] Pulmonary Rehabilitation.

## 2024-03-08 DIAGNOSIS — C50922 Malignant neoplasm of unspecified site of left male breast: Secondary | ICD-10-CM

## 2024-03-08 NOTE — Progress Notes (Signed)
 Daily Session Note  Patient Details  Name: Juan Calhoun MRN: [id number] Date of Birth: 1941-03-19 Referring Provider:   Doristine Devoid Cancer Associated Rehabilitation & Exercise from 12/06/2023 in [facility] Cardiac and Pulmonary Rehab  Referring Provider Gerarda Fraction, MD       Encounter Date: 03/08/2024  Check In:  Session Check In - 03/08/24 1224       Check-In   Supervising physician immediately available to respond to emergencies See telemetry face sheet for immediately available ER MD    Location [facility]-Cardiac & Pulmonary Rehab    Staff Present Rory Percy, MS, Exercise Physiologist;Maxon Conetta BS, Exercise Physiologist    Virtual Visit No    Medication changes reported     No    Fall or balance concerns reported    No    Warm-up and Cool-down Performed on first and last piece of equipment    Resistance Training Performed Yes    VAD Patient? No    PAD/SET Patient? No      Pain Assessment   Currently in Pain? No/denies    Multiple Pain Sites No                Social History   Tobacco Use  Smoking Status Former   Current packs/day: 0.00   Types: Cigarettes   Quit date: 1986   Years since quitting: 39.2  Smokeless Tobacco Never    Goals Met:  Independence with exercise equipment Exercise tolerated well No report of concerns or symptoms today  Goals Unmet:  Not Applicable  Comments: Pt able to follow exercise prescription today without complaint.  Will continue to monitor for progression.    Dr. Bethann Punches is Medical Director for [facility] Cardiac Rehabilitation.  Dr. Vida Rigger is Medical Director for [facility] Pulmonary Rehabilitation.

## 2024-03-13 VITALS — Ht 70.2 in | Wt 202.9 lb

## 2024-03-13 DIAGNOSIS — C50922 Malignant neoplasm of unspecified site of left male breast: Secondary | ICD-10-CM

## 2024-03-13 NOTE — Progress Notes (Signed)
 Daily Session Note  Patient Details  Name: Juan Calhoun MRN: [id number] Date of Birth: 04/09/1941 Referring Provider:   Doristine Devoid Cancer Associated Rehabilitation & Exercise from 12/06/2023 in [facility] Cardiac and Pulmonary Rehab  Referring Provider Gerarda Fraction, MD       Encounter Date: 03/13/2024  Check In:  Session Check In - 03/13/24 1301       Check-In   Supervising physician immediately available to respond to emergencies See telemetry face sheet for immediately available ER MD    Location [facility]-Cardiac & Pulmonary Rehab    Staff Present Rory Percy, MS, Exercise Physiologist;Noah Tickle, BS, Exercise Physiologist    Virtual Visit No    Medication changes reported     No    Fall or balance concerns reported    No    Warm-up and Cool-down Performed on first and last piece of equipment    Resistance Training Performed Yes    VAD Patient? No    PAD/SET Patient? No      Pain Assessment   Currently in Pain? No/denies    Multiple Pain Sites No                Social History   Tobacco Use  Smoking Status Former   Current packs/day: 0.00   Types: Cigarettes   Quit date: 1986   Years since quitting: 39.2  Smokeless Tobacco Never    Goals Met:  Independence with exercise equipment Exercise tolerated well No report of concerns or symptoms today  Goals Unmet:  Not Applicable  Comments:  [name] graduated today from  rehab with 24 sessions completed.  Details of the patient's exercise prescription and what He needs to do in order to continue the prescription and progress were discussed with patient.  Patient was given a copy of prescription and goals.  Patient verbalized understanding. [name] plans to continue to exercise by using his exercise bike at home, and dumbbells and resistance bands.    Dr. Bethann Punches is Medical Director for [facility] Cardiac Rehabilitation.  Dr. Vida Rigger is Medical Director for [facility] Pulmonary Rehabilitation.

## 2024-03-13 NOTE — Patient Instructions (Signed)
 Discharge Patient Instructions  Patient Details  Name: DAWAUN BRANCATO MRN: [id number] Date of Birth: 12/30/1940 Referring Provider:  Lauro Regulus, MD   Number of Visits: 24  Reason for Discharge:  Patient reached a stable level of exercise. Patient independent in their exercise. Patient has met program and personal goals.  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 [facility])  Initial Exercise Prescription:  Initial Exercise Prescription - 12/06/23 1400       Date of Initial Exercise RX and Referring Provider   Date 12/06/23    Referring Provider Gerarda Fraction, MD      Oxygen   Maintain Oxygen Saturation 88% or higher      Treadmill   MPH 1.6    Grade 0    Minutes 15    METs 2.23      Recumbant Bike   Level 2    RPM 50    Watts 15    Minutes 15    METs 2.2      NuStep   Level 2    SPM 80    Minutes 15    METs 2.2      REL-XR   Level 2    Speed 50    Minutes 15    METs 2.2      T5 Nustep   Level 2   T6   SPM 80    Minutes 15    METs 2.2      Prescription Details   Frequency (times per week) 2    Duration Progress to 30 minutes of continuous aerobic without signs/symptoms of physical distress      Intensity   THRR 40-80% of Max Heartrate 100-127    Ratings of Perceived Exertion 11-13    Perceived Dyspnea 0-4      Progression   Progression Continue to progress workloads to maintain intensity without signs/symptoms of physical distress.      Resistance Training   Training Prescription Yes    Weight 3 lb    Reps 10-15             Discharge Exercise Prescription (Final Exercise Prescription Changes):  Exercise Prescription Changes - 02/07/24 1300       Home Exercise Plan   Plans to continue exercise at Home (comment)   Exercise bike, dumbbells and resistance bands   Frequency Add 3 additional days to program exercise sessions.    Initial Home Exercises Provided 02/07/24             Functional Capacity:   6 Minute Walk     Row Name 12/06/23 1419 03/13/24 1302       6 Minute Walk   Phase Initial Discharge    Distance 1200 feet 1480 feet    Distance % Change -- 23.33 %    Distance Feet Change -- 280 ft    Walk Time 6 minutes 6 minutes    # of Rest Breaks 0 0    MPH 2.27 2.8    METS 2.2 2.63    RPE 11 16    Perceived Dyspnea  0 1    VO2 Peak 7.71 9.2    Symptoms No No    Resting HR 73 bpm 62 bpm    Resting BP 110/64 108/60    Resting Oxygen Saturation  95 % 96 %    Exercise Oxygen Saturation  during 6 min walk 91 % 91 %    Max Ex. HR 97 bpm 101  bpm    Max Ex. BP 136/62 130/60    2 Minute Post BP 102/58 130/60            Nutrition & Weight - Outcomes:  Pre Biometrics - 12/06/23 1431       Pre Biometrics   Height 5' 10.2" (1.783 m)    Weight 202 lb 4.8 oz (91.8 kg)    BMI (Calculated) 28.86    Single Leg Stand 7 seconds             Post Biometrics - 03/13/24 1305        Post  Biometrics   Height 5' 10.2" (1.783 m)    Weight 202 lb 14.4 oz (92 kg)    BMI (Calculated) 28.95    Single Leg Stand 5 seconds

## 2024-04-26 ENCOUNTER — Inpatient Hospital Stay: Payer: Medicare Other | Attending: Oncology | Admitting: Oncology

## 2024-04-26 ENCOUNTER — Encounter: Payer: Self-pay | Admitting: Oncology

## 2024-04-26 VITALS — BP 124/80 | HR 62 | Temp 97.3°F | Resp 16 | Ht 70.2 in | Wt 197.0 lb

## 2024-04-26 DIAGNOSIS — L989 Disorder of the skin and subcutaneous tissue, unspecified: Secondary | ICD-10-CM | POA: Insufficient documentation

## 2024-04-26 DIAGNOSIS — Z1721 Progesterone receptor positive status: Secondary | ICD-10-CM | POA: Diagnosis not present

## 2024-04-26 DIAGNOSIS — Z1732 Human epidermal growth factor receptor 2 negative status: Secondary | ICD-10-CM | POA: Insufficient documentation

## 2024-04-26 DIAGNOSIS — Z803 Family history of malignant neoplasm of breast: Secondary | ICD-10-CM | POA: Diagnosis not present

## 2024-04-26 DIAGNOSIS — C50922 Malignant neoplasm of unspecified site of left male breast: Secondary | ICD-10-CM | POA: Insufficient documentation

## 2024-04-26 DIAGNOSIS — Z87891 Personal history of nicotine dependence: Secondary | ICD-10-CM | POA: Diagnosis not present

## 2024-04-26 DIAGNOSIS — R5383 Other fatigue: Secondary | ICD-10-CM | POA: Insufficient documentation

## 2024-04-26 DIAGNOSIS — Z79810 Long term (current) use of selective estrogen receptor modulators (SERMs): Secondary | ICD-10-CM | POA: Insufficient documentation

## 2024-04-26 DIAGNOSIS — Z170 Estrogen receptor positive status [ER+]: Secondary | ICD-10-CM | POA: Diagnosis not present

## 2024-04-26 NOTE — Progress Notes (Signed)
 Feels very tired all the time. Thinks it has gotten worse while taking tamoxifen .

## 2024-04-26 NOTE — Progress Notes (Signed)
 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  INTERVAL HISTORY: Patient returns to clinic today for routine 23-month evaluation.  He has chronic fatigue, but otherwise feels well.  He is tolerating tamoxifen  without significant side effects.  He has no neurologic complaints.  He denies any recent fevers or illnesses.  He has a good appetite and denies weight loss.  He has no chest pain, shortness of breath, cough, or hemoptysis.  He denies any nausea, vomiting, constipation, or diarrhea.  He has no urinary complaints.  Patient offers no further specific complaints today.  REVIEW OF SYSTEMS:   Review of Systems  Constitutional:  Positive for malaise/fatigue. Negative for fever and weight loss.  Respiratory: Negative.  Negative for cough, hemoptysis and shortness of breath.   Cardiovascular: Negative.  Negative for chest pain and leg swelling.  Gastrointestinal: Negative.  Negative for abdominal pain.  Genitourinary: Negative.  Negative for dysuria.  Musculoskeletal: Negative.  Negative for back pain.  Skin: Negative.  Negative for rash.  Neurological: Negative.  Negative for dizziness, focal weakness, weakness and headaches.  Psychiatric/Behavioral: Negative.  The patient is not nervous/anxious.     As per HPI. Otherwise, a complete review of systems is negative.  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   Osteoarthritis     PAST SURGICAL HISTORY: Past Surgical History:  Procedure Laterality Date   APPENDECTOMY     AXILLARY LYMPH NODE DISSECTION Left 05/05/2023   Procedure: AXILLARY LYMPH NODE DISSECTION;  Surgeon: Conrado Delay, DO;  Location: [facility] ORS;  Service: General;  Laterality: Left;   BREAST BIOPSY Left 04/01/2023   u/s bx, COIL clip- [facility]   BREAST BIOPSY Left 03/31/2023   u/s bx axilla-HYDROMARK(coil) METASTATIC   BREAST BIOPSY Left 04/01/2023   US  LT BREAST BX W LOC DEV 1ST LESION IMG BX SPEC US  GUIDE 04/01/2023 [facility]   BREAST BIOPSY Right 04/20/2023   (NOT DONE)    US  RT BREAST BX W LOC DEV 1ST LESION IMG BX SPEC US  GUIDE 04/20/2023 [facility]   BREAST BIOPSY Left 05/03/2023   US  LT RADIO FREQUENCY TAG LOC US  GUIDE 05/03/2023 [facility]   CATARACT EXTRACTION Bilateral    CHOLECYSTECTOMY     COLONOSCOPY  01/27/2021   COLONOSCOPY WITH PROPOFOL  N/A 09/19/2015   Procedure: COLONOSCOPY WITH PROPOFOL ;  Surgeon: Luella Sager, MD;  Location: [facility] ENDOSCOPY;  Service: Endoscopy;  Laterality: N/A;   TOTAL MASTECTOMY Left 05/05/2023   Procedure: TOTAL MASTECTOMY w/ RF tag breast and axillary;  Surgeon: Conrado Delay, DO;  Location: [facility];  Service: General;  Laterality: Left;    FAMILY HISTORY: Family History  Problem Relation Age of Onset  Breast cancer Mother    Heart disease Father    Heart attack Brother    Heart disease Maternal Uncle     ADVANCED DIRECTIVES (Y/N):  N  HEALTH MAINTENANCE: Social History   Tobacco Use   Smoking status: Former    Current packs/day: 0.00    Types: Cigarettes    Quit date: 1986    Years since quitting: 39.3   Smokeless tobacco:  Never  Vaping Use   Vaping status: Never Used  Substance Use Topics   Alcohol use: No   Drug use: No     Colonoscopy:  PAP:  Bone density:  Lipid panel:  No Known Allergies  Current Outpatient Medications  Medication Sig Dispense Refill   atorvastatin (LIPITOR) 40 MG tablet Take 40 mg by mouth daily.     cyanocobalamin (VITAMIN B12) 1000 MCG tablet Take 1,000 mcg by mouth daily.     tamoxifen  (NOLVADEX ) 20 MG tablet Take 1 tablet (20 mg total) by mouth daily. 90 tablet 3   TRULICITY 3 MG/0.5ML SOPN Inject 3 mg into the skin once a week. Sunday     No current facility-administered medications for this visit.    OBJECTIVE: Vitals:   04/26/24 1106  BP: 124/80  Pulse: 62  Resp: 16  Temp: (!) 97.3 F (36.3 C)  SpO2: 100%      Body mass index is 28.11 kg/m.    ECOG FS:0 - Asymptomatic  General: Well-developed, well-nourished, no acute distress. Eyes: Pink conjunctiva, anicteric sclera. HEENT: Normocephalic, moist mucous membranes. Lungs: No audible wheezing or coughing. Heart: Regular rate and rhythm. Abdomen: Soft, nontender, no obvious distention. Musculoskeletal: No edema, cyanosis, or clubbing. Neuro: Alert, answering all questions appropriately. Cranial nerves grossly intact. Skin: No rashes or petechiae noted. Psych: Normal affect.  LAB RESULTS:  Lab Results  Component Value Date   NA 137 05/06/2023   K 4.2 05/06/2023   CL 110 05/06/2023   CO2 21 (L) 05/06/2023   GLUCOSE 174 (H) 05/06/2023   BUN 20 05/06/2023   CREATININE 1.19 05/06/2023   CALCIUM 8.4 (L) 05/06/2023   GFRNONAA >60 05/06/2023    Lab Results  Component Value Date   WBC 8.2 07/25/2023   HGB 15.1 07/25/2023   HCT 46.9 07/25/2023   MCV 94.2 07/25/2023   PLT 127 (L) 07/25/2023     STUDIES: No results found.  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  Oncotype score 4.  PLAN:    Stage Ia ER/PR positive, HER2 negative  invasive carcinoma of the left breast: Patient underwent mastectomy and axillary node sampling on May 05, 2023.  He has noted to have 2 axillary lymph nodes positive for disease.  Despite this, he had a low risk Oncotype DX score of 4 and therefore adjuvant chemotherapy was not necessary.  Patient completed adjuvant XRT on August 08, 2023.  Continue tamoxifen  for a total of 5 years completing in August 2029.  No further intervention is needed.  Return to clinic in 6 months for routine evaluation. Fatigue: Likely multifactorial.  Monitor. Skin lesions: Patient was given a referral to dermatology for routine yearly skin exam.   Patient expressed understanding and was in agreement with this plan. He also understands that He can call clinic at any time with any questions, concerns, or complaints.    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

## 2024-08-08 ENCOUNTER — Other Ambulatory Visit: Payer: Self-pay | Admitting: Oncology

## 2024-08-12 ENCOUNTER — Other Ambulatory Visit: Payer: Self-pay | Admitting: Medical Genetics

## 2024-08-15 ENCOUNTER — Other Ambulatory Visit (HOSPITAL_COMMUNITY)
Admission: RE | Admit: 2024-08-15 | Discharge: 2024-08-15 | Disposition: A | Discharge status: Home / Self Care | Source: Ambulatory Visit | Attending: Oncology | Admitting: Oncology

## 2024-08-26 LAB — GENECONNECT MOLECULAR SCREEN: Genetic Analysis Overall Interpretation: NEGATIVE

## 2024-10-26 ENCOUNTER — Inpatient Hospital Stay

## 2024-10-26 ENCOUNTER — Inpatient Hospital Stay: Attending: Oncology | Admitting: Oncology

## 2024-10-26 ENCOUNTER — Encounter: Payer: Self-pay | Admitting: Oncology

## 2024-10-26 VITALS — BP 112/72 | HR 84 | Temp 97.2°F | Resp 18 | Wt 194.0 lb

## 2024-10-26 DIAGNOSIS — Z923 Personal history of irradiation: Secondary | ICD-10-CM | POA: Diagnosis not present

## 2024-10-26 DIAGNOSIS — Z1732 Human epidermal growth factor receptor 2 negative status: Secondary | ICD-10-CM | POA: Insufficient documentation

## 2024-10-26 DIAGNOSIS — Z87891 Personal history of nicotine dependence: Secondary | ICD-10-CM | POA: Diagnosis not present

## 2024-10-26 DIAGNOSIS — R5382 Chronic fatigue, unspecified: Secondary | ICD-10-CM | POA: Diagnosis not present

## 2024-10-26 DIAGNOSIS — Z1721 Progesterone receptor positive status: Secondary | ICD-10-CM | POA: Diagnosis not present

## 2024-10-26 DIAGNOSIS — Z803 Family history of malignant neoplasm of breast: Secondary | ICD-10-CM | POA: Diagnosis not present

## 2024-10-26 DIAGNOSIS — Z79810 Long term (current) use of selective estrogen receptor modulators (SERMs): Secondary | ICD-10-CM | POA: Insufficient documentation

## 2024-10-26 DIAGNOSIS — C50922 Malignant neoplasm of unspecified site of left male breast: Secondary | ICD-10-CM

## 2024-10-26 DIAGNOSIS — Z170 Estrogen receptor positive status [ER+]: Secondary | ICD-10-CM | POA: Insufficient documentation

## 2024-10-26 NOTE — Progress Notes (Signed)
 Patient is wanting to know if he is cancer free? Does he need to have anymore tests done?

## 2024-10-26 NOTE — Progress Notes (Signed)
 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  INTERVAL HISTORY: Patient returns to clinic today for routine 52-month evaluation.  He continues to have chronic fatigue, but otherwise feels well.  He is tolerating tamoxifen  without significant side effects.  He has no neurologic complaints.  He denies any recent fevers or illnesses.  He has a good appetite and denies weight loss.  He has no chest pain, shortness of breath, cough, or hemoptysis.  He denies any nausea, vomiting, constipation, or diarrhea.  He has no urinary complaints.  Patient offers no further specific complaints today.  REVIEW OF SYSTEMS:   Review of Systems  Constitutional:  Positive for malaise/fatigue. Negative for fever and weight loss.  Respiratory: Negative.  Negative for cough, hemoptysis and shortness of breath.   Cardiovascular: Negative.  Negative for chest pain and leg swelling.  Gastrointestinal: Negative.  Negative for abdominal pain.  Genitourinary: Negative.  Negative for dysuria.  Musculoskeletal: Negative.  Negative for back pain.  Skin: Negative.  Negative for rash.  Neurological: Negative.  Negative for dizziness, focal weakness, weakness and headaches.  Psychiatric/Behavioral: Negative.  The patient is not nervous/anxious.    As per HPI. Otherwise, a complete review of systems is negative.   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   Osteoarthritis     PAST SURGICAL HISTORY: Past Surgical History:  Procedure Laterality Date   APPENDECTOMY     AXILLARY LYMPH NODE DISSECTION Left 05/05/2023   Procedure: AXILLARY LYMPH NODE DISSECTION;  Surgeon: Tye Millet, DO;  Location: [facility];  Service: General;  Laterality: Left;   BREAST BIOPSY Left 04/01/2023   u/s bx, COIL clip- [facility]   BREAST BIOPSY Left 03/31/2023   u/s bx axilla-HYDROMARK(coil) METASTATIC   BREAST BIOPSY Left 04/01/2023   US  LT BREAST BX W LOC DEV 1ST LESION IMG BX SPEC US  GUIDE 04/01/2023 [facility]   BREAST BIOPSY Right 04/20/2023   (NOT DONE)    US  RT BREAST BX W LOC DEV 1ST LESION IMG BX SPEC US  GUIDE 04/20/2023 [facility]   BREAST BIOPSY Left 05/03/2023   US  LT RADIO FREQUENCY TAG LOC US  GUIDE 05/03/2023 [facility]   CATARACT EXTRACTION Bilateral    CHOLECYSTECTOMY     COLONOSCOPY  01/27/2021   COLONOSCOPY WITH PROPOFOL  N/A 09/19/2015   Procedure: COLONOSCOPY WITH PROPOFOL ;  Surgeon: Donnice Vaughn Manes, MD;  Location: [facility] ENDOSCOPY;  Service: Endoscopy;  Laterality: N/A;   TOTAL MASTECTOMY Left 05/05/2023   Procedure: TOTAL MASTECTOMY w/ RF tag breast and axillary;  Surgeon: Tye Millet, DO;  Location: [facility];  Service: General;  Laterality: Left;    FAMILY HISTORY: Family History  Problem Relation Age of  Onset   Breast cancer Mother    Heart disease Father    Heart attack Brother    Heart disease Maternal Uncle     ADVANCED DIRECTIVES (Y/N):  N  HEALTH MAINTENANCE: Social History   Tobacco Use   Smoking status: Former    Current packs/day: 0.00    Types: Cigarettes    Quit date: 1986    Years since quitting: 39.8    Smokeless tobacco: Never  Vaping Use   Vaping status: Never Used  Substance Use Topics   Alcohol use: No   Drug use: No     Colonoscopy:  PAP:  Bone density:  Lipid panel:  No Known Allergies  Current Outpatient Medications  Medication Sig Dispense Refill   atorvastatin (LIPITOR) 40 MG tablet Take 40 mg by mouth daily.     cyanocobalamin (VITAMIN B12) 1000 MCG tablet Take 1,000 mcg by mouth daily.     prednisoLONE acetate (PRED FORTE) 1 % ophthalmic suspension 1 drop.     tamoxifen  (NOLVADEX ) 20 MG tablet TAKE ONE TABLET BY MOUTH ONCE DAILY 30 tablet 30   triamcinolone ointment (KENALOG) 0.1 % Apply to itchy red rash 2 times daily until resolved, use as needed for itching     TRULICITY 3 MG/0.5ML SOPN Inject 3 mg into the skin once a week. Sunday     No current facility-administered medications for this visit.    OBJECTIVE: Vitals:   10/26/24 1120  BP: 112/72  Pulse: 84  Resp: 18  Temp: (!) 97.2 F (36.2 C)  SpO2: 98%       Body mass index is 27.68 kg/m.    ECOG FS:0 - Asymptomatic  General: Well-developed, well-nourished, no acute distress. Eyes: Pink conjunctiva, anicteric sclera. HEENT: Normocephalic, moist mucous membranes. Lungs: No audible wheezing or coughing. Heart: Regular rate and rhythm. Abdomen: Soft, nontender, no obvious distention. Musculoskeletal: No edema, cyanosis, or clubbing. Neuro: Alert, answering all questions appropriately. Cranial nerves grossly intact. Skin: No rashes or petechiae noted. Psych: Normal affect.  LAB RESULTS:  Lab Results  Component Value Date   NA 137 05/06/2023   K 4.2 05/06/2023   CL 110 05/06/2023   CO2 21 (L) 05/06/2023   GLUCOSE 174 (H) 05/06/2023   BUN 20 05/06/2023   CREATININE 1.19 05/06/2023   CALCIUM 8.4 (L) 05/06/2023   GFRNONAA >60 05/06/2023    Lab Results  Component Value Date   WBC 8.2 07/25/2023   HGB 15.1 07/25/2023   HCT 46.9 07/25/2023   MCV 94.2 07/25/2023   PLT 127 (L) 07/25/2023      STUDIES: No results found.  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  Oncotype score 4.  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  He has noted to have 2 axillary lymph nodes positive for disease.  Despite this, he had a low risk Oncotype DX score of 4 and therefore adjuvant chemotherapy was not necessary.  Patient completed adjuvant XRT on August 08, 2023.  Continue tamoxifen  for a total of 5 years completing in August 2029.  CA 27-29 is pending at time of dictation.  No further intervention is needed.  Return to clinic in 6 months for routine evaluation.   Fatigue: Chronic and unchanged.  Likely multifactorial.  Monitor.   Patient expressed understanding and was in agreement with this plan. He also  understands that He can call clinic at any time with any questions, concerns, or complaints.    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

## 2024-10-30 LAB — CANCER ANTIGEN 27.29: CA 27.29: 42.6 U/mL — ABNORMAL HIGH (ref 0.0–38.6)

## 2024-10-31 ENCOUNTER — Other Ambulatory Visit: Payer: Self-pay | Admitting: *Deleted

## 2024-10-31 DIAGNOSIS — C50922 Malignant neoplasm of unspecified site of left male breast: Secondary | ICD-10-CM

## 2024-12-10 ENCOUNTER — Inpatient Hospital Stay: Attending: Oncology

## 2024-12-10 DIAGNOSIS — Z1732 Human epidermal growth factor receptor 2 negative status: Secondary | ICD-10-CM | POA: Diagnosis not present

## 2024-12-10 DIAGNOSIS — Z1721 Progesterone receptor positive status: Secondary | ICD-10-CM | POA: Diagnosis not present

## 2024-12-10 DIAGNOSIS — Z170 Estrogen receptor positive status [ER+]: Secondary | ICD-10-CM | POA: Diagnosis not present

## 2024-12-10 DIAGNOSIS — C50922 Malignant neoplasm of unspecified site of left male breast: Secondary | ICD-10-CM | POA: Diagnosis present

## 2024-12-10 DIAGNOSIS — Z79810 Long term (current) use of selective estrogen receptor modulators (SERMs): Secondary | ICD-10-CM | POA: Insufficient documentation

## 2024-12-11 LAB — CANCER ANTIGEN 27.29: CA 27.29: 31.6 U/mL (ref 0.0–38.6)

## 2024-12-12 ENCOUNTER — Telehealth: Payer: Self-pay | Admitting: *Deleted

## 2024-12-12 NOTE — Telephone Encounter (Signed)
 RN placed call to patient to review results of CA 27-29. Per MD, Ca27.29 now normal. Keep f/u as scheduled. Patient verbalized understanding and is in agreement with plan. Encouraged to call with any questions or concerns.

## 2025-04-22 ENCOUNTER — Inpatient Hospital Stay

## 2025-04-23 ENCOUNTER — Inpatient Hospital Stay: Admitting: Oncology
